# Patient Record
Sex: Male | Born: 1965 | Race: White | Hispanic: No | Marital: Single | Smoking: Former smoker
Health system: Southern US, Community
[De-identification: ages and names within clinical notes are randomized; demographics above are authoritative.]

## PROBLEM LIST (undated history)

## (undated) DIAGNOSIS — M1990 Unspecified osteoarthritis, unspecified site: Secondary | ICD-10-CM

## (undated) DIAGNOSIS — M109 Gout, unspecified: Secondary | ICD-10-CM

## (undated) DIAGNOSIS — W28XXXA Contact with powered lawn mower, initial encounter: Secondary | ICD-10-CM

## (undated) HISTORY — DX: Gout, unspecified: M10.9

## (undated) HISTORY — DX: Contact with powered lawn mower, initial encounter: W28.XXXA

## (undated) HISTORY — PX: COLONOSCOPY: SHX174

---
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
## 2001-12-23 HISTORY — PX: MANDIBLE FRACTURE SURGERY: SHX706

## 2005-04-02 ENCOUNTER — Encounter: Payer: Self-pay | Admitting: General Practice

## 2005-04-22 ENCOUNTER — Encounter: Payer: Self-pay | Admitting: General Practice

## 2007-12-10 ENCOUNTER — Ambulatory Visit: Payer: Self-pay | Admitting: Gastroenterology

## 2010-11-21 ENCOUNTER — Ambulatory Visit: Payer: Self-pay | Admitting: General Practice

## 2013-02-27 ENCOUNTER — Emergency Department: Payer: Self-pay | Admitting: Emergency Medicine

## 2013-03-10 ENCOUNTER — Emergency Department: Payer: Self-pay

## 2013-10-06 ENCOUNTER — Ambulatory Visit: Payer: Self-pay | Admitting: General Practice

## 2015-03-30 ENCOUNTER — Ambulatory Visit (INDEPENDENT_AMBULATORY_CARE_PROVIDER_SITE_OTHER): Payer: BLUE CROSS/BLUE SHIELD | Admitting: General Surgery

## 2015-03-30 ENCOUNTER — Other Ambulatory Visit: Payer: BLUE CROSS/BLUE SHIELD

## 2015-03-30 ENCOUNTER — Encounter: Payer: Self-pay | Admitting: General Surgery

## 2015-03-30 VITALS — BP 124/78 | HR 70 | Resp 14 | Ht 68.0 in | Wt 169.0 lb

## 2015-03-30 DIAGNOSIS — R2231 Localized swelling, mass and lump, right upper limb: Secondary | ICD-10-CM | POA: Diagnosis not present

## 2015-03-30 NOTE — Patient Instructions (Addendum)
The patient is aware to call back for any questions or concerns.  Patient's surgery has been scheduled for 04-04-15 at [facility].

## 2015-03-30 NOTE — Progress Notes (Signed)
Patient ID: Jamie [name], male   DOB: [birth year], 49 y.o.   MRN: [id number]  Chief Complaint  Patient presents with  . Mass    under right arm    HPI Jamie [name] is a 49 y.o. male.  Here today for evaluation of mass under his right arm. He states it was not there when he had his physical first part of March. He noticed it drying off from a shower on the 27th. Denies pain and has not changed in size. Denies any infection, injury or trauma to the arm.  He is here today with his mother, [name].  HPI  Past Medical History  Diagnosis Date  . Accident caused by powered lawn [place]  . Gout     Past Surgical History  Procedure Laterality Date  . Mandible fracture surgery  2003    Family History  Problem Relation Age of Onset  . Cancer Father     colon    Social History History  Substance Use Topics  . Smoking status: Former Smoker -- 1 years    Quit date: 12/24/1991  . Smokeless tobacco: Never Used  . Alcohol Use: 0.0 oz/week    0 Standard drinks or equivalent per week     Comment: 3-4/day    No Known Allergies  Current Outpatient Prescriptions  Medication Sig Dispense Refill  . allopurinol (ZYLOPRIM) 100 MG tablet Take 400 mg by mouth daily.   2   No current facility-administered medications for this visit.    Review of Systems Review of Systems  Constitutional: Negative.   Respiratory: Negative.   Cardiovascular: Negative.     Blood pressure 124/78, pulse 70, resp. rate 14, height 5\' 8"  (1.727 m), weight 169 lb (76.658 kg).  Physical Exam Physical Exam  Constitutional: He is oriented to person, place, and time. He appears well-developed and well-nourished.  Neck: Neck supple.  Cardiovascular: Normal rate, regular rhythm and normal heart sounds.   Pulmonary/Chest: Effort normal and breath sounds normal.  Abdominal: Soft. Normal appearance. There is no splenomegaly or hepatomegaly. There is no tenderness.  Musculoskeletal:        Arms: Lymphadenopathy:    He has no cervical adenopathy.    He has no axillary adenopathy.       Right: No inguinal adenopathy present.       Left: No inguinal adenopathy present.  No left axilla adenopathy  Neurological: He is alert and oriented to person, place, and time.  Skin: Skin is warm and dry.  3 x 6 cm smooth egg shaped mass top of right axilla.   Data Assessment: Ultrasound examination of the mass showed a heterogeneous lesion with focal areas of increased vascular flow. The axillary artery was cephalad by the lesion. No additional adenopathy noted.  Assessment    Right axillary mass, unclear etiology.    Plan    Excision will be necessary to determine the source of this lesion.     Risk and benefits of excision of the nodule discussed.   Patient's surgery has been scheduled for 04-04-15 at [facility].  PCP:  [facility] Ref: Dr. Jonny Ruiz  Robert Bellow 03/31/2015, 10:15 PM

## 2015-03-31 ENCOUNTER — Other Ambulatory Visit: Payer: Self-pay | Admitting: General Surgery

## 2015-03-31 DIAGNOSIS — R2230 Localized swelling, mass and lump, unspecified upper limb: Secondary | ICD-10-CM | POA: Insufficient documentation

## 2015-03-31 DIAGNOSIS — R2231 Localized swelling, mass and lump, right upper limb: Secondary | ICD-10-CM

## 2015-04-04 ENCOUNTER — Encounter: Payer: Self-pay | Admitting: General Surgery

## 2015-04-04 ENCOUNTER — Ambulatory Visit
Admit: 2015-04-04 | Disposition: A | Discharge status: Home / Self Care | Payer: Self-pay | Attending: General Surgery | Admitting: General Surgery

## 2015-04-04 DIAGNOSIS — D1721 Benign lipomatous neoplasm of skin and subcutaneous tissue of right arm: Secondary | ICD-10-CM | POA: Diagnosis not present

## 2015-04-04 HISTORY — PX: OTHER SURGICAL HISTORY: SHX169

## 2015-04-04 LAB — CBC WITH DIFFERENTIAL/PLATELET
EOS PCT: 1 %
HCT: 48 % (ref 40.0–52.0)
HGB: 15.6 g/dL (ref 13.0–18.0)
Lymphocytes: 60 %
MCH: 30.2 pg (ref 26.0–34.0)
MCHC: 32.4 g/dL (ref 32.0–36.0)
MCV: 93 fL (ref 80–100)
MONOS PCT: 6 %
Platelet: 194 10*3/uL (ref 150–440)
RBC: 5.14 10*6/uL (ref 4.40–5.90)
RDW: 14.6 % — ABNORMAL HIGH (ref 11.5–14.5)
SEGMENTED NEUTROPHILS: 21 %
Variant Lymphocyte - H1-Rlymph: 12 %
WBC: 9.1 10*3/uL (ref 3.8–10.6)

## 2015-04-05 ENCOUNTER — Encounter: Payer: Self-pay | Admitting: General Surgery

## 2015-04-06 ENCOUNTER — Encounter: Payer: Self-pay | Admitting: General Surgery

## 2015-04-07 ENCOUNTER — Telehealth: Payer: Self-pay | Admitting: *Deleted

## 2015-04-07 ENCOUNTER — Telehealth: Payer: Self-pay | Admitting: General Surgery

## 2015-04-07 NOTE — Telephone Encounter (Signed)
Pt called office back and he aware of his pathology results.

## 2015-04-07 NOTE — Telephone Encounter (Signed)
Lipoma pathology was benign

## 2015-04-13 ENCOUNTER — Ambulatory Visit (INDEPENDENT_AMBULATORY_CARE_PROVIDER_SITE_OTHER): Payer: BLUE CROSS/BLUE SHIELD | Admitting: General Surgery

## 2015-04-13 ENCOUNTER — Encounter: Payer: Self-pay | Admitting: General Surgery

## 2015-04-13 VITALS — BP 130/78 | HR 74 | Resp 14 | Ht 68.0 in | Wt 165.0 lb

## 2015-04-13 DIAGNOSIS — R2231 Localized swelling, mass and lump, right upper limb: Secondary | ICD-10-CM

## 2015-04-13 NOTE — Progress Notes (Signed)
Patient ID: Jamie [name], male   DOB: [birth year], 49 y.o.   MRN: [id number]  Chief Complaint  Patient presents with  . Routine Post Op    right arm excision    HPI Jamie [name] is a 49 y.o. male here today having undergone excision of a right axillary lipoma on 04/04/15. Patient states he is doing well. Minimal pain at the surgical site, some staining down the proximal aspect of the upper medial arm. HPI  Past Medical History  Diagnosis Date  . Accident caused by powered lawn [place]  . Gout     Past Surgical History  Procedure Laterality Date  . Mandible fracture surgery  2003  . Excision right arm mass  04/04/15    Family History  Problem Relation Age of Onset  . Cancer Father     colon    Social History History  Substance Use Topics  . Smoking status: Former Smoker -- 1 years    Quit date: 12/24/1991  . Smokeless tobacco: Never Used  . Alcohol Use: 0.0 oz/week    0 Standard drinks or equivalent per week     Comment: 3-4/day    No Known Allergies  Current Outpatient Prescriptions  Medication Sig Dispense Refill  . allopurinol (ZYLOPRIM) 100 MG tablet Take 400 mg by mouth daily.   2   No current facility-administered medications for this visit.    Review of Systems Review of Systems  Constitutional: Negative.   Respiratory: Negative.   Cardiovascular: Negative.     Blood pressure 130/78, pulse 74, resp. rate 14, height 5\' 8"  (1.727 m), weight 165 lb (74.844 kg).  Physical Exam Physical Exam  Constitutional: He is oriented to person, place, and time. He appears well-developed and well-nourished.  Neurological: He is alert and oriented to person, place, and time.  Skin: Skin is warm and dry.  Right axilla incision is clean and healing well.     Data Review  Lipoma without evidence of malignancy.  Assessment    Doing well post excision of axillary lipoma. No evidence of seroma.     Plan    The patient will increase his activity as  tolerated. We'll plan on his returning to work on 04/17/2015.  Patient to return as needed.      PCP:  Kandace Parkins 04/13/2015, 8:53 PM

## 2015-04-13 NOTE — Patient Instructions (Signed)
Patient to return as needed. 

## 2015-04-17 LAB — SURGICAL PATHOLOGY

## 2015-04-23 NOTE — Op Note (Signed)
PATIENT NAME:  Jamie [name], Jamie [name] MR#:  [id number] DATE OF BIRTH:  1966-05-15  DATE OF PROCEDURE:  04/04/2015  PREOPERATIVE DIAGNOSIS: Right axillary mass.   POSTOPERATIVE DIAGNOSIS: Right axillary lipoma.   OPERATIVE PROCEDURE: Excision right axillary lipoma.   OPERATING SURGEON: Robert Bellow, M.D.   ANESTHESIA: General by LMA under Dr. Kayleen Memos, Marcaine 0.5% with 1:200,000 units of epinephrine 15 mL local infiltration.   CLINICAL NOTE:  This 49 year old male presented with a new onset mass in the right axilla. Ultrasound showed a homogeneous area with increased peripheral flow. No clear adenopathy. He was considered a candidate for excision. Preoperative CBC was normal.   OPERATIVE NOTE: With the patient under adequate general anesthesia and a small roll behind the shoulder, the area of the right axilla, chest, and upper arm was prepped with ChloraPrep and draped. The axillary artery had been pushed cephalad by the mass and this was identified by palpation and marked. A curvilinear incision in the upper aspect of the axilla parallel to the artery was made and carried down through the skin and subcutaneous tissue with hemostasis achieved by electrocautery and 3-0 Vicryl ties. Marcaine had been infiltrated for postoperative analgesia. After opening the axillary envelope, a lipoma-like mass was identified. This had dissected itself posterior to the axillary vessels and towards the supraspinatus muscle. This was gently freed circumferentially with blunt dissection with the few vascular pedicles controlled with 3-0 Vicryl ties. The specimen was sent for routine histology. Good hemostasis was noted. The wound was closed in layers with 2-0 Vicryl to the axillary envelope and a running 4-0 Vicryl subcuticular suture for the skin. The lesion measured approximately 8 cm in maximum diameter.   Benzoin, Steri-Strips, Telfa, and Tegaderm dressing were applied.   The patient tolerated the procedure well and  was taken to the recovery room in stable condition.   ____________________________ Robert Bellow, MD jwb:sp D: 04/05/2015 08:44:57 ET T: 04/05/2015 10:52:40 ET JOB#: [id number]  cc: Robert Bellow, MD, <Dictator> Nelda Severe. Burt Ek, MD Park Breed, MD Ennifer Harston Amedeo Kinsman MD ELECTRONICALLY SIGNED 04/07/2015 0:23

## 2016-06-17 ENCOUNTER — Ambulatory Visit (INDEPENDENT_AMBULATORY_CARE_PROVIDER_SITE_OTHER): Payer: Self-pay

## 2016-06-17 ENCOUNTER — Ambulatory Visit
Admission: EM | Admit: 2016-06-17 | Discharge: 2016-06-17 | Disposition: A | Discharge status: Home / Self Care | Payer: Self-pay | Attending: Family Medicine | Admitting: Family Medicine

## 2016-06-17 DIAGNOSIS — S82402A Unspecified fracture of shaft of left fibula, initial encounter for closed fracture: Secondary | ICD-10-CM

## 2016-06-17 DIAGNOSIS — M79605 Pain in left leg: Secondary | ICD-10-CM

## 2016-06-17 DIAGNOSIS — R52 Pain, unspecified: Secondary | ICD-10-CM

## 2016-06-17 DIAGNOSIS — M25571 Pain in right ankle and joints of right foot: Secondary | ICD-10-CM

## 2016-06-17 DIAGNOSIS — M79604 Pain in right leg: Secondary | ICD-10-CM

## 2016-06-17 MED ORDER — HYDROCODONE-ACETAMINOPHEN 5-325 MG PO TABS: ORAL_TABLET | ORAL | Status: DC

## 2016-06-17 MED ORDER — KETOROLAC TROMETHAMINE 60 MG/2ML IM SOLN
60.0000 mg | Freq: Once | INTRAMUSCULAR | Status: AC
  Administered 2016-06-17: 60 mg via INTRAMUSCULAR

## 2016-06-17 NOTE — ED Notes (Signed)
Patient presents with bilateral lower leg pain. Patient states he was mowing early today around noon when he had to jump off his zero turn lawn mower. The mower was still engaged and when it started it took off and the patient was hit by the mower on both his legs.

## 2016-06-17 NOTE — ED Provider Notes (Signed)
CSN: [id number]     Arrival date & time 06/17/16  1930 History   First MD Initiated Contact with Patient 06/17/16 2034     Chief Complaint  Patient presents with  . Leg Pain   (Consider location/radiation/quality/duration/timing/severity/associated sxs/prior Treatment) HPI Comments: 50 yo male with a c/o bilateral lower leg pain after injuring them earlier this evening with his lawn mower. Patient states the lawn mower took off and he was hit by the mower on both his legs as he was chasing it. Denies lacerations or bleeding. Complains of pain and swelling to both ankles and his left mid shin area. Denies any numbness/tingling or discoloration of his legs.   The history is provided by the patient.    Past Medical History  Diagnosis Date  . Accident caused by powered lawn [place]  . Gout    Past Surgical History  Procedure Laterality Date  . Mandible fracture surgery  2003  . Excision right arm mass  04/04/15   Family History  Problem Relation Age of Onset  . Cancer Father     colon   Social History  Substance Use Topics  . Smoking status: Former Smoker -- 1 years    Quit date: 12/24/1991  . Smokeless tobacco: Never Used  . Alcohol Use: 0.0 oz/week    0 Standard drinks or equivalent per week     Comment: 3-4/day    Review of Systems  Allergies  Review of patient's allergies indicates no known allergies.  Home Medications   Prior to Admission medications   Medication Sig Start Date End Date Taking? Authorizing Provider  allopurinol (ZYLOPRIM) 100 MG tablet Take 400 mg by mouth daily.  03/13/15  Yes Historical Provider, MD  HYDROcodone-acetaminophen (NORCO/VICODIN) 5-325 MG tablet 1-2 tabs po q 8 hours prn 06/17/16   Norval Gable, MD   Meds Ordered and Administered this Visit   Medications  ketorolac (TORADOL) injection 60 mg (60 mg Intramuscular Given 06/17/16 2048)    BP 151/91 mmHg  Pulse 60  Temp(Src) 98.9 F (37.2 C) (Oral)  Resp 18  Ht 5\' 8"  (1.727 m)   Wt 168 lb (76.204 kg)  BMI 25.55 kg/m2  SpO2 100% No data found.   Physical Exam  Constitutional: He appears well-developed and well-nourished. No distress.  Musculoskeletal: He exhibits edema.       Right ankle: He exhibits decreased range of motion and swelling. He exhibits no ecchymosis, no deformity, no laceration and normal pulse. Tenderness. Lateral malleolus and AITFL tenderness found. No medial malleolus, no CF ligament, no posterior TFL, no head of 5th metatarsal and no proximal fibula tenderness found. Achilles tendon normal.       Left ankle: He exhibits decreased range of motion and swelling. He exhibits no ecchymosis, no deformity, no laceration and normal pulse. Tenderness. Lateral malleolus and AITFL tenderness found. No medial malleolus, no CF ligament, no posterior TFL, no head of 5th metatarsal and no proximal fibula tenderness found. Achilles tendon normal.       Left lower leg: He exhibits tenderness (lateral upper third of lower leg), swelling and edema. He exhibits no deformity and no laceration.       Legs: Bilateral lower extremities neurovascularly intact  Skin: He is not diaphoretic.  Nursing note and vitals reviewed.   ED Course  Procedures (including critical care time)  Labs Review Labs Reviewed - No data to display  Imaging Review Dg Tibia/fibula Left  06/17/2016  CLINICAL DATA:  Pain after trauma EXAM:  LEFT TIBIA AND FIBULA - 2 VIEW COMPARISON:  None. FINDINGS: There is a mildly displaced spiral fracture of the proximal fibular diaphysis. No other acute abnormalities. IMPRESSION: Fracture of the proximal fibula. Electronically Signed   By: Dorise Bullion III M.D   On: 06/17/2016 20:12   Dg Tibia/fibula Right  06/17/2016  CLINICAL DATA:  Pain after trauma. EXAM: RIGHT TIBIA AND FIBULA - 2 VIEW COMPARISON:  None. FINDINGS: There is no evidence of fracture or other focal bone lesions. Soft tissues are unremarkable. IMPRESSION: Negative. Electronically Signed    By: Dorise Bullion III M.D   On: 06/17/2016 20:13   Dg Ankle Complete Left  06/17/2016  CLINICAL DATA:  Pain after fall. EXAM: LEFT ANKLE COMPLETE - 3+ VIEW COMPARISON:  None. FINDINGS: Calcifications adjacent to the medial malleolus are consistent with an age indeterminate avulsion injury. There is no overlapping soft tissue swelling. No convincing evidence of acute fracture. The ankle mortise is intact. IMPRESSION: Calcifications adjacent to the medial malleolus are age indeterminate but favored to be chronic as there is no overlapping soft tissue swelling. The findings are consistent with sequela of an avulsion injury. No other abnormalities. Electronically Signed   By: Dorise Bullion III M.D   On: 06/17/2016 21:04   Dg Ankle Complete Right  06/17/2016  CLINICAL DATA:  Pain after trauma EXAM: RIGHT ANKLE - COMPLETE 3+ VIEW COMPARISON:  None. FINDINGS: Mild lateral soft tissue swelling.  No fractures identified. IMPRESSION: Lateral soft tissue swelling. Electronically Signed   By: Dorise Bullion III M.D   On: 06/17/2016 21:05     Visual Acuity Review  Right Eye Distance:   Left Eye Distance:   Bilateral Distance:    Right Eye Near:   Left Eye Near:    Bilateral Near:         MDM   1. Fibula fracture, left, closed, initial encounter   2. Pain    Discharge Medication List as of 06/17/2016  9:19 PM    START taking these medications   Details  HYDROcodone-acetaminophen (NORCO/VICODIN) 5-325 MG tablet 1-2 tabs po q 8 hours prn, Print       1. x-ray results and diagnosis reviewed with patient 2. rx as per orders above; reviewed possible side effects, interactions, risks and benefits  3. Left lower extremity immobilized with posterior leg splint and sugar tong; crutches for non-weightbearing 4. Follow-up in next 1-2 days with orthopedist [facility])  Norval Gable, MD 06/17/16 2153

## 2016-06-17 NOTE — Discharge Instructions (Signed)
Tibial and Fibular Fracture, Adult °Tibial and fibular fracture is a break in the bones of your lower leg (tibia and fibula). The tibia is the larger of these two bones. The fibula is the smaller of the two bones. It is on the outer side of your leg.  °CAUSES °· Low-energy injuries, such as a fall from ground level. °· High-energy injuries, such as motor vehicle injuries, gunshot wounds, or high-speed sports collisions. °RISK FACTORS °· Jumping activities. °· Repetitive stress, such as long-distance running. °· Participation in sports. °· Osteoporosis. °· Advanced age. °SIGNS AND SYMPTOMS °· Pain. °· Swelling. °· Inability to put weight on your injured leg. °· Bone deformities at the site of your injury. °· Bruising. °DIAGNOSIS  °Tibial and fibular fractures are diagnosed with the use of X-ray exams. °TREATMENT  °If you have a simple fracture of these two bones, they can be treated with simple immobilization. A cast or splint will be used on your leg to keep it from moving while it heals. Then you can begin range-of-motion exercises to regain your knee motion. °HOME CARE INSTRUCTIONS  °· Apply ice to your leg: °¨ Put ice in a plastic bag. °¨ Place a towel between your skin and the bag. °¨ Leave the ice on for 20 minutes, 2-3 times a day. °· If you have a plaster or fiberglass cast: °¨ Do not try to scratch the skin under the cast using sharp or pointed objects. °¨ Check the skin around the cast every day. You may put lotion on any red or sore areas. °¨ Keep your cast dry and clean. °· If you have a plaster splint: °¨ Wear the splint as directed. °¨ You may loosen the elastic around the splint if your toes become numb, tingle, or turn cold or blue. °· Do not put pressure on any part of your cast or splint until it is fully hardened, because it may deform. °· Your cast or splint can be protected during bathing with a plastic bag. Do not lower the cast or splint into water. °· Use crutches as directed. °· Only take  over-the-counter or prescription medicines for pain, discomfort, or fever as directed by your health care provider. °· Follow all instructions given to you by your health care provider. °· Make and keep all follow-up appointments. °SEEK MEDICAL CARE IF: °· Your pain is becoming worse rather than better or is not controlled with medicines. °· You have increased swelling or redness in the foot. °· You begin to lose feeling in your foot or toes. °SEEK IMMEDIATE MEDICAL CARE IF: °· You develop a cold or blue foot or toes on the injured side. °· You develop severe pain in your injured leg, especially if the pain is increased with movement of your toes. °MAKE SURE YOU: °· Understand these instructions. °· Will watch your condition. °· Will get help right away if you are not doing well or get worse. °  °This information is not intended to replace advice given to you by your health care provider. Make sure you discuss any questions you have with your health care provider. °  °Document Released: 08/31/2002 Document Revised: 04/25/2015 Document Reviewed: 07/21/2013 °Elsevier Interactive Patient Education ©2016 [employer] ° °

## 2016-06-26 ENCOUNTER — Encounter: Payer: Self-pay | Admitting: Emergency Medicine

## 2017-12-23 DIAGNOSIS — K5790 Diverticulosis of intestine, part unspecified, without perforation or abscess without bleeding: Secondary | ICD-10-CM

## 2017-12-23 HISTORY — DX: Diverticulosis of intestine, part unspecified, without perforation or abscess without bleeding: K57.90

## 2018-01-31 IMAGING — CR DG ANKLE COMPLETE 3+V*R*
3 series · 3 of 3 positions shown · non-contrast
Comparison: None.

CLINICAL DATA: Pain after trauma

EXAM:
RIGHT ANKLE - COMPLETE 3+ VIEW

[ankle ap]
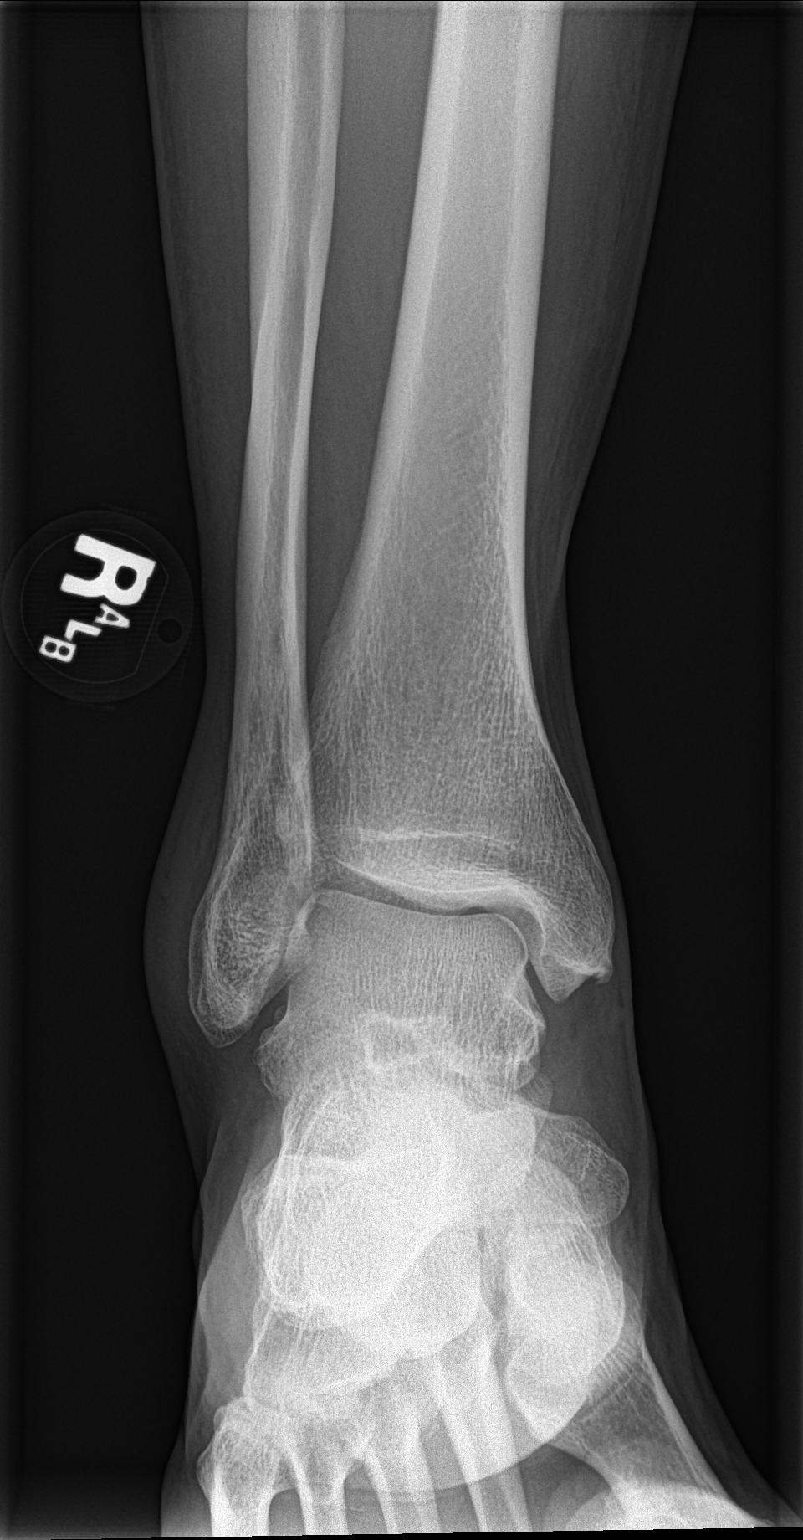

[ankle obl]
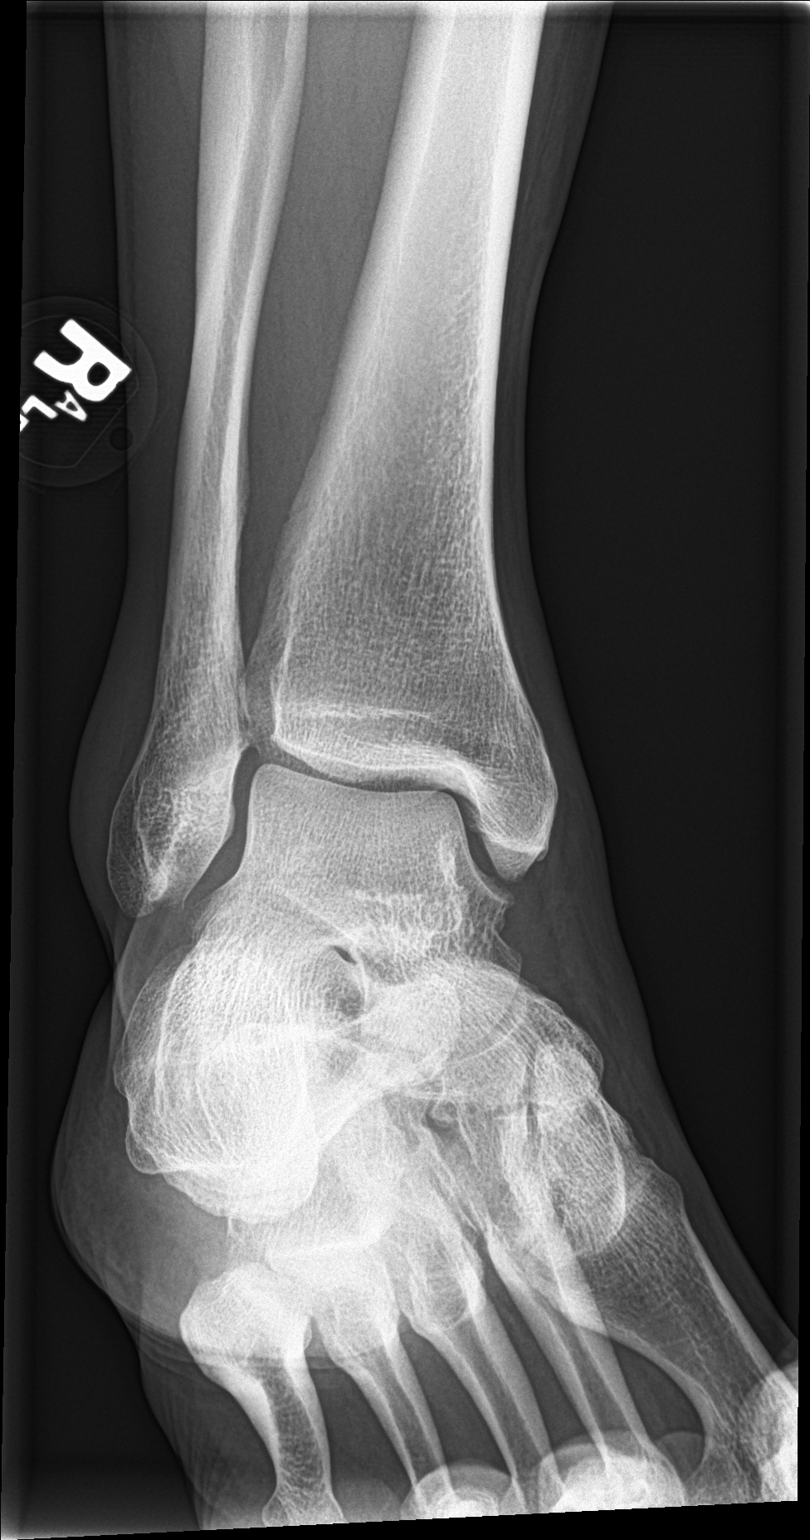

[ankle lat]
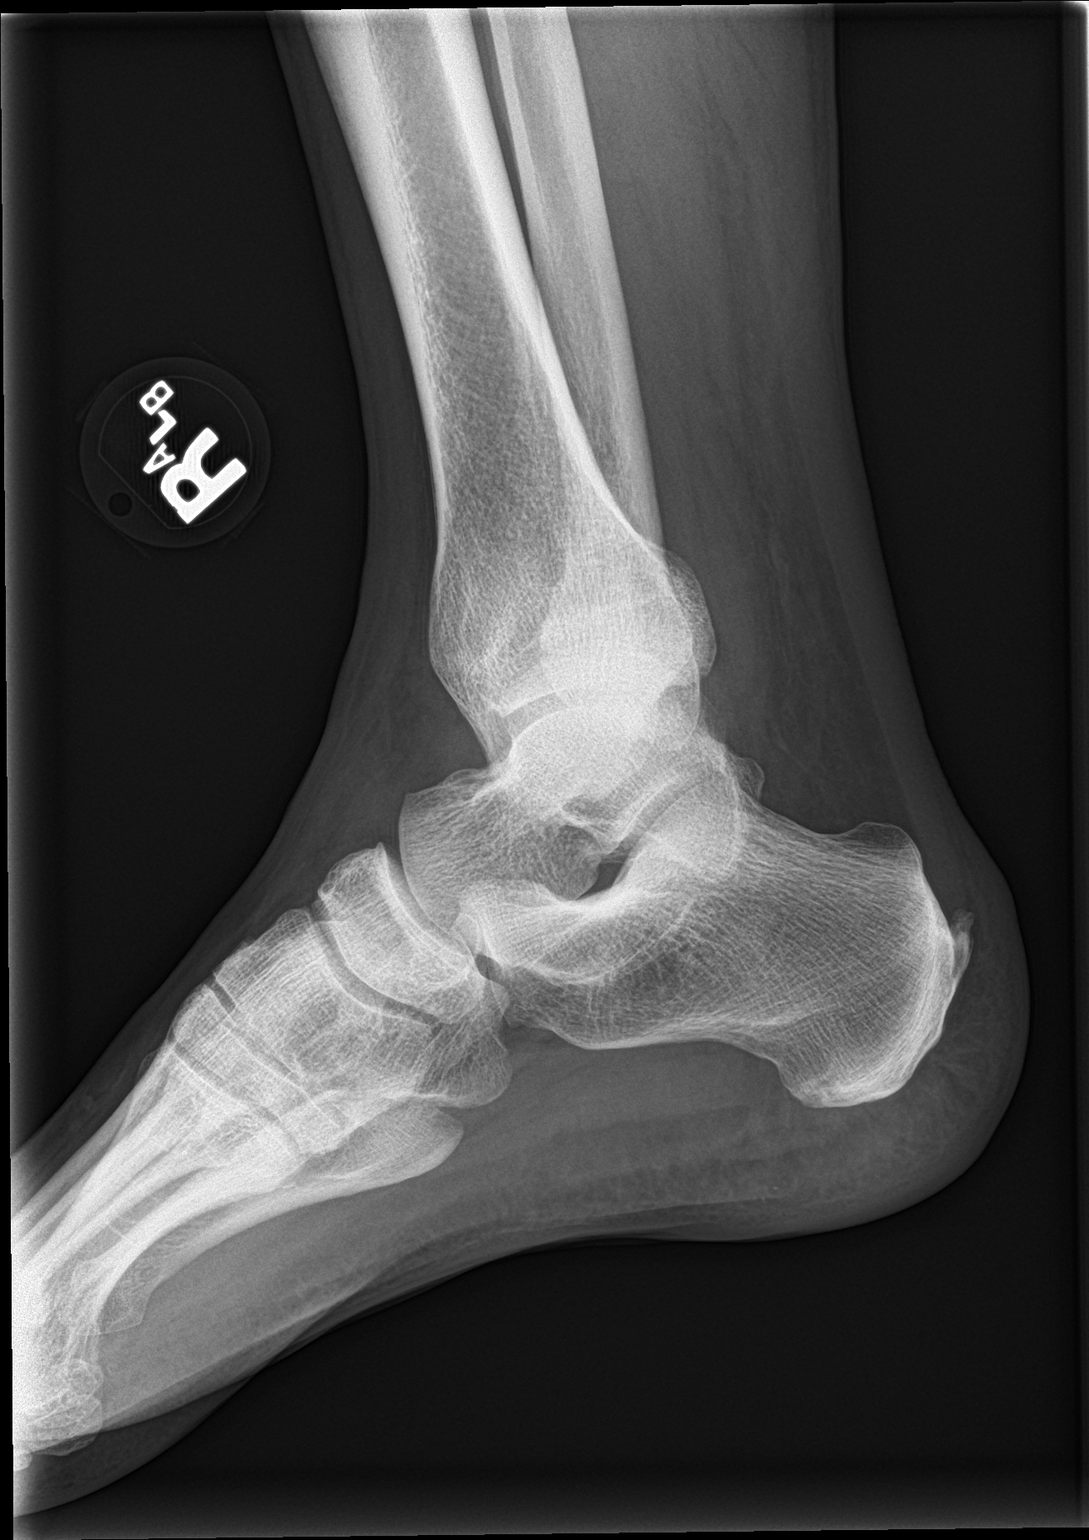

[3 of 3 positions shown; findings below may reference images not displayed]

FINDINGS: Mild lateral soft tissue swelling.  No fractures identified.
IMPRESSION: Lateral soft tissue swelling.

## 2021-02-27 ENCOUNTER — Other Ambulatory Visit: Payer: Self-pay | Admitting: Physician Assistant

## 2021-02-27 ENCOUNTER — Other Ambulatory Visit: Payer: Self-pay

## 2021-02-27 ENCOUNTER — Ambulatory Visit
Admission: RE | Admit: 2021-02-27 | Discharge: 2021-02-27 | Disposition: A | Discharge status: Home / Self Care | Payer: No Typology Code available for payment source | Source: Ambulatory Visit | Attending: Physician Assistant | Admitting: Physician Assistant

## 2021-02-27 ENCOUNTER — Ambulatory Visit
Admission: RE | Admit: 2021-02-27 | Discharge: 2021-02-27 | Disposition: A | Discharge status: Home / Self Care | Payer: No Typology Code available for payment source | Attending: Physician Assistant | Admitting: Physician Assistant

## 2021-02-27 DIAGNOSIS — M25571 Pain in right ankle and joints of right foot: Secondary | ICD-10-CM

## 2021-10-05 ENCOUNTER — Encounter
Admission: RE | Admit: 2021-10-05 | Discharge: 2021-10-05 | Disposition: A | Discharge status: Home / Self Care | Payer: Managed Care, Other (non HMO) | Source: Ambulatory Visit | Attending: Specialist | Admitting: Specialist

## 2021-10-05 ENCOUNTER — Other Ambulatory Visit: Payer: Self-pay

## 2021-10-05 HISTORY — DX: Unspecified osteoarthritis, unspecified site: M19.90

## 2021-10-05 NOTE — Patient Instructions (Addendum)
Your procedure is scheduled on: Monday, October 24 Report to the Registration Desk on the 1st floor of the [facility]. To find out your arrival time, please call [phone] between 1PM - 3PM on: Friday, October 21  REMEMBER: Instructions that are not followed completely may result in serious medical risk, up to and including death; or upon the discretion of your surgeon and anesthesiologist your surgery may need to be rescheduled.  Do not eat food after midnight the night before surgery.  No gum chewing, lozengers or hard candies.  You may however, drink CLEAR liquids up to 2 hours before you are scheduled to arrive for your surgery. Do not drink anything within 2 hours of your scheduled arrival time.  Clear liquids include: - water  - apple juice without pulp - gatorade (not RED, PURPLE, OR BLUE) - black coffee or tea (Do NOT add milk or creamers to the coffee or tea) Do NOT drink anything that is not on this list.  DO NOT TAKE ANY MEDICATIONS THE MORNING OF SURGERY   One week prior to surgery: STARTING October 17 Stop MELOXICAM, AND Anti-inflammatories (NSAIDS) such as Advil, Aleve, Ibuprofen, Motrin, Naproxen, Naprosyn and Aspirin based products such as Excedrin, Goodys Powder, BC Powder. Stop ANY OVER THE COUNTER supplements until after surgery. You may however, continue to take Tylenol if needed for pain up until the day of surgery.  No Alcohol for 24 hours before or after surgery.  No Smoking including e-cigarettes for 24 hours prior to surgery.  No chewable tobacco products for at least 6 hours prior to surgery.  No nicotine patches on the day of surgery.  Do not use any "recreational" drugs for at least a week prior to your surgery.  Please be advised that the combination of cocaine and anesthesia may have negative outcomes, up to and including death. If you test positive for cocaine, your surgery will be cancelled.  On the morning of surgery brush your teeth with  toothpaste and water, you may rinse your mouth with mouthwash if you wish. Do not swallow any toothpaste or mouthwash.  Shower using antibacterial soap prior to arriving at the hospital on the day of surgery.  Do not wear jewelry.  Do not wear lotions, powders, or perfumes.   Do not bring valuables to the hospital. [facility] is not responsible for any missing/lost belongings or valuables.   Notify your doctor if there is any change in your medical condition (cold, fever, infection).  Wear comfortable clothing (specific to your surgery type) to the hospital.  If you are being discharged the day of surgery, you will not be allowed to drive home. You will need a responsible adult (18 years or older) to drive you home and stay with you that night.   If you are taking public transportation, you will need to have a responsible adult (18 years or older) with you. Please confirm with your physician that it is acceptable to use public transportation.   Please call the [place] Dept. at [phone] if you have any questions about these instructions.  Surgery Visitation Policy:  Patients undergoing a surgery or procedure may have one family member or support person with them as long as that person is not COVID-19 positive or experiencing its symptoms.  That person may remain in the waiting area during the procedure and may rotate out with other people.

## 2021-10-08 ENCOUNTER — Other Ambulatory Visit: Payer: Self-pay | Admitting: Specialist

## 2021-10-08 NOTE — H&P (Signed)
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  Symptoms are rated as moderate to severe, and have been worsening.  He has failed nonoperative treatment.  This is significantly impairing activities of daily living.  He has elected for surgical management.   Past Medical History:  Diagnosis Date   Accident caused by powered lawn mower 12/23/1980   Diverticulosis 2019   Gout    Osteoarthritis    Past Surgical History:  Procedure Laterality Date   AMPUTATION TOE Right 1980   partial great toe and second toe; lawn mower accident   COLONOSCOPY     2008, 2019   excision right arm mass  04/04/2015   lipoma   MANDIBLE FRACTURE SURGERY  12/23/2001   Social History   Socioeconomic History   Marital status: Single    Spouse name: Not on file   Number of children: Not on file   Years of education: Not on file   Highest education level: Not on file  Occupational History   Not on file  Tobacco Use   Smoking status: Former    Years: 1.00    Types: Cigarettes    Quit date: 12/24/1991    Years since quitting: 29.8   Smokeless tobacco: Never  Vaping Use   Vaping Use: Never used  Substance and Sexual Activity   Alcohol use: Yes    Comment: 6/day beer   Drug use: No   Sexual activity: Not on file  Other Topics Concern   Not on file  Social History Narrative   Not on file   Social Determinants of Health   Financial Resource Strain: Not on file  Food Insecurity: Not on file  Transportation Needs: Not on file  Physical Activity: Not on file  Stress: Not on file  Social Connections: Not on file   Family History  Problem Relation Age of Onset   Cancer Father        colon   No Known Allergies Prior to Admission medications   Medication Sig Start Date  End Date Taking? Authorizing Provider  allopurinol (ZYLOPRIM) 100 MG tablet Take 400 mg by mouth daily.  03/13/15   [provider]  meloxicam (MOBIC) 15 MG tablet Take 15 mg by mouth daily.    [provider]     Positive ROS: All other systems have been reviewed and were otherwise negative with the exception of those mentioned in the HPI and as above.  Physical Exam: General: Alert, no acute distress Cardiovascular: No pedal edema. Heart is regular and without murmur.  Respiratory: No cyanosis, no use of accessory musculature. Lungs are clear. GI: No organomegaly, abdomen is soft and non-tender Skin: No lesions in the area of chief complaint Neurologic: Sensation intact distally Psychiatric: Patient is competent for consent with normal mood and affect Lymphatic: No axillary or cervical lymphadenopathy  MUSCULOSKELETAL: Prominence of the left thumb CMC joint.  Lateral subluxation of the metacarpal.  Severely positive grind test.  Decreased pinch.  Neurovascular status is normal.  Median nerve compression is negative.  The skin is intact.  Assessment: M18. 1 2 osteoarthritis of first carpometacarpal joint, left thumb  Plan: Plan for Procedure(s): CARPOMETACARPAL (CMC) arthroplasty OF THUMB using the palmaris longus tendon  The risks benefits and alternatives were discussed with the patient including but  not limited to the risks of nonoperative treatment, versus surgical intervention including infection, bleeding, nerve injury,  blood clots, cardiopulmonary complications, morbidity, mortality, among others, and they were willing to proceed.   Park Breed, MD [phone]   10/08/2021 9:27 PM

## 2021-10-11 ENCOUNTER — Inpatient Hospital Stay: Admission: RE | Admit: 2021-10-11 | Payer: Managed Care, Other (non HMO) | Source: Ambulatory Visit

## 2021-10-15 ENCOUNTER — Ambulatory Visit: Payer: Managed Care, Other (non HMO)

## 2021-10-15 ENCOUNTER — Other Ambulatory Visit: Payer: Self-pay

## 2021-10-15 ENCOUNTER — Ambulatory Visit: Payer: Managed Care, Other (non HMO) | Admitting: Certified Registered"

## 2021-10-15 ENCOUNTER — Encounter
Admission: RE | Disposition: A | Discharge status: Home / Self Care | Payer: Self-pay | Source: Ambulatory Visit | Attending: Specialist

## 2021-10-15 ENCOUNTER — Ambulatory Visit
Admission: RE | Admit: 2021-10-15 | Discharge: 2021-10-15 | Disposition: A | Discharge status: Home / Self Care | Payer: Managed Care, Other (non HMO) | Source: Ambulatory Visit | Attending: Specialist | Admitting: Specialist

## 2021-10-15 ENCOUNTER — Encounter: Payer: Self-pay | Admitting: Specialist

## 2021-10-15 DIAGNOSIS — Z791 Long term (current) use of non-steroidal anti-inflammatories (NSAID): Secondary | ICD-10-CM | POA: Diagnosis not present

## 2021-10-15 DIAGNOSIS — Z79899 Other long term (current) drug therapy: Secondary | ICD-10-CM | POA: Insufficient documentation

## 2021-10-15 DIAGNOSIS — Z87891 Personal history of nicotine dependence: Secondary | ICD-10-CM | POA: Diagnosis not present

## 2021-10-15 DIAGNOSIS — M1812 Unilateral primary osteoarthritis of first carpometacarpal joint, left hand: Secondary | ICD-10-CM | POA: Insufficient documentation

## 2021-10-15 HISTORY — PX: CARPOMETACARPAL (CMC) FUSION OF THUMB: SHX6290

## 2021-10-15 SURGERY — CARPOMETACARPAL (CMC) FUSION OF THUMB
Anesthesia: General | Site: Thumb | Laterality: Left

## 2021-10-15 MED ORDER — CEFAZOLIN SODIUM-DEXTROSE 2-4 GM/100ML-% IV SOLN
2.0000 g | INTRAVENOUS | Status: AC
  Administered 2021-10-15: 2 g via INTRAVENOUS

## 2021-10-15 MED ORDER — CLINDAMYCIN PHOSPHATE 600 MG/50ML IV SOLN
600.0000 mg | INTRAVENOUS | Status: AC
  Administered 2021-10-15: 600 mg via INTRAVENOUS

## 2021-10-15 MED ORDER — FENTANYL CITRATE (PF) 100 MCG/2ML IJ SOLN
INTRAMUSCULAR | Status: DC | PRN
  Administered 2021-10-15: 50 ug via INTRAVENOUS
  Administered 2021-10-15: 25 ug via INTRAVENOUS

## 2021-10-15 MED ORDER — BUPIVACAINE HCL (PF) 0.5 % IJ SOLN
INTRAMUSCULAR | Status: DC | PRN
  Administered 2021-10-15: 16 mL

## 2021-10-15 MED ORDER — CHLORHEXIDINE GLUCONATE 0.12 % MT SOLN: 15.0000 mL | Freq: Once | OROMUCOSAL | Status: AC

## 2021-10-15 MED ORDER — CLINDAMYCIN PHOSPHATE 600 MG/50ML IV SOLN
INTRAVENOUS | Status: AC
  Filled 2021-10-15: qty 50

## 2021-10-15 MED ORDER — GABAPENTIN 300 MG PO CAPS
ORAL_CAPSULE | ORAL | Status: AC
  Administered 2021-10-15: 300 mg via ORAL
  Filled 2021-10-15: qty 1

## 2021-10-15 MED ORDER — GABAPENTIN 300 MG PO CAPS: 300.0000 mg | ORAL_CAPSULE | ORAL | Status: AC

## 2021-10-15 MED ORDER — CHLORHEXIDINE GLUCONATE 0.12 % MT SOLN
OROMUCOSAL | Status: AC
  Administered 2021-10-15: 15 mL via OROMUCOSAL
  Filled 2021-10-15: qty 15

## 2021-10-15 MED ORDER — CHLORHEXIDINE GLUCONATE CLOTH 2 % EX PADS
6.0000 | MEDICATED_PAD | Freq: Once | CUTANEOUS | Status: AC
  Administered 2021-10-15: 6 via TOPICAL

## 2021-10-15 MED ORDER — CEFAZOLIN SODIUM-DEXTROSE 2-4 GM/100ML-% IV SOLN
INTRAVENOUS | Status: AC
  Filled 2021-10-15: qty 100

## 2021-10-15 MED ORDER — HYDROCODONE-ACETAMINOPHEN 5-325 MG PO TABS
ORAL_TABLET | ORAL | Status: AC
  Filled 2021-10-15: qty 1

## 2021-10-15 MED ORDER — DEXAMETHASONE SODIUM PHOSPHATE 10 MG/ML IJ SOLN
INTRAMUSCULAR | Status: AC
  Filled 2021-10-15: qty 1

## 2021-10-15 MED ORDER — PROPOFOL 10 MG/ML IV BOLUS
INTRAVENOUS | Status: AC
  Filled 2021-10-15: qty 20

## 2021-10-15 MED ORDER — OXYCODONE HCL 5 MG PO TABS
ORAL_TABLET | ORAL | Status: AC
  Filled 2021-10-15: qty 1

## 2021-10-15 MED ORDER — MIDAZOLAM HCL 2 MG/2ML IJ SOLN
INTRAMUSCULAR | Status: AC
  Filled 2021-10-15: qty 2

## 2021-10-15 MED ORDER — ONDANSETRON HCL 4 MG/2ML IJ SOLN
INTRAMUSCULAR | Status: AC
  Filled 2021-10-15: qty 2

## 2021-10-15 MED ORDER — EPHEDRINE SULFATE 50 MG/ML IJ SOLN
INTRAMUSCULAR | Status: DC | PRN
  Administered 2021-10-15: 10 mg via INTRAVENOUS
  Administered 2021-10-15: 15 mg via INTRAVENOUS
  Administered 2021-10-15: 10 mg via INTRAVENOUS

## 2021-10-15 MED ORDER — DEXMEDETOMIDINE HCL IN NACL 400 MCG/100ML IV SOLN
INTRAVENOUS | Status: DC | PRN
  Administered 2021-10-15: 12 ug via INTRAVENOUS

## 2021-10-15 MED ORDER — ACETAMINOPHEN 10 MG/ML IV SOLN
INTRAVENOUS | Status: DC | PRN
  Administered 2021-10-15: 1000 mg via INTRAVENOUS

## 2021-10-15 MED ORDER — OXYCODONE HCL 5 MG PO TABS
5.0000 mg | ORAL_TABLET | Freq: Once | ORAL | Status: AC
Start: 2021-10-15 — End: 2021-10-15
  Administered 2021-10-15: 5 mg via ORAL

## 2021-10-15 MED ORDER — FENTANYL CITRATE (PF) 100 MCG/2ML IJ SOLN
INTRAMUSCULAR | Status: AC
  Filled 2021-10-15: qty 2

## 2021-10-15 MED ORDER — FAMOTIDINE 20 MG PO TABS: 20.0000 mg | ORAL_TABLET | Freq: Once | ORAL | Status: AC

## 2021-10-15 MED ORDER — EPHEDRINE 5 MG/ML INJ
INTRAVENOUS | Status: AC
  Filled 2021-10-15: qty 10

## 2021-10-15 MED ORDER — SEVOFLURANE IN SOLN
RESPIRATORY_TRACT | Status: AC
  Filled 2021-10-15: qty 250

## 2021-10-15 MED ORDER — MELOXICAM 7.5 MG PO TABS: 15.0000 mg | ORAL_TABLET | ORAL | Status: AC

## 2021-10-15 MED ORDER — ACETAMINOPHEN 10 MG/ML IV SOLN
INTRAVENOUS | Status: AC
  Filled 2021-10-15: qty 100

## 2021-10-15 MED ORDER — DEXAMETHASONE SODIUM PHOSPHATE 10 MG/ML IJ SOLN
INTRAMUSCULAR | Status: DC | PRN
  Administered 2021-10-15: 4 mg via INTRAVENOUS

## 2021-10-15 MED ORDER — FENTANYL CITRATE (PF) 100 MCG/2ML IJ SOLN: 25.0000 ug | INTRAMUSCULAR | Status: DC | PRN

## 2021-10-15 MED ORDER — SODIUM CHLORIDE 0.9 % IR SOLN: Status: DC | PRN

## 2021-10-15 MED ORDER — HYDROCODONE-ACETAMINOPHEN 5-325 MG PO TABS: 1.0000 | ORAL_TABLET | Freq: Four times a day (QID) | ORAL | 0 refills | Status: DC | PRN

## 2021-10-15 MED ORDER — ONDANSETRON HCL 4 MG/2ML IJ SOLN
INTRAMUSCULAR | Status: DC | PRN
  Administered 2021-10-15: 4 mg via INTRAVENOUS

## 2021-10-15 MED ORDER — HYDROCODONE-ACETAMINOPHEN 5-325 MG PO TABS
1.0000 | ORAL_TABLET | Freq: Once | ORAL | Status: DC
Start: 2021-10-15 — End: 2021-10-15

## 2021-10-15 MED ORDER — BUPIVACAINE HCL (PF) 0.5 % IJ SOLN
INTRAMUSCULAR | Status: AC
  Filled 2021-10-15: qty 30

## 2021-10-15 MED ORDER — MELOXICAM 7.5 MG PO TABS
ORAL_TABLET | ORAL | Status: AC
  Administered 2021-10-15: 15 mg via ORAL
  Filled 2021-10-15: qty 2

## 2021-10-15 MED ORDER — PROMETHAZINE HCL 25 MG/ML IJ SOLN: 6.2500 mg | INTRAMUSCULAR | Status: DC | PRN

## 2021-10-15 MED ORDER — PHENYLEPHRINE HCL-NACL 20-0.9 MG/250ML-% IV SOLN
INTRAVENOUS | Status: AC
  Filled 2021-10-15: qty 250

## 2021-10-15 MED ORDER — GABAPENTIN 400 MG PO CAPS: 400.0000 mg | ORAL_CAPSULE | Freq: Three times a day (TID) | ORAL | 3 refills | Status: DC

## 2021-10-15 MED ORDER — PROPOFOL 10 MG/ML IV BOLUS
INTRAVENOUS | Status: DC | PRN
  Administered 2021-10-15: 300 mg via INTRAVENOUS
  Administered 2021-10-15: 100 mg via INTRAVENOUS

## 2021-10-15 MED ORDER — FAMOTIDINE 20 MG PO TABS
ORAL_TABLET | ORAL | Status: AC
  Administered 2021-10-15: 20 mg via ORAL
  Filled 2021-10-15: qty 1

## 2021-10-15 MED ORDER — MIDAZOLAM HCL 2 MG/2ML IJ SOLN
INTRAMUSCULAR | Status: DC | PRN
  Administered 2021-10-15: 2 mg via INTRAVENOUS

## 2021-10-15 MED ORDER — PROPOFOL 1000 MG/100ML IV EMUL
INTRAVENOUS | Status: AC
  Filled 2021-10-15: qty 100

## 2021-10-15 MED ORDER — ORAL CARE MOUTH RINSE: 15.0000 mL | Freq: Once | OROMUCOSAL | Status: AC

## 2021-10-15 MED ORDER — LACTATED RINGERS IV SOLN: INTRAVENOUS | Status: DC

## 2021-10-15 SURGICAL SUPPLY — 50 items
BLADE OSC/SAGITTAL MD 5.5X18 (BLADE) ×2 IMPLANT
BLADE SURG MINI STRL (BLADE) ×2 IMPLANT
BNDG ESMARK 4X12 TAN STRL LF (GAUZE/BANDAGES/DRESSINGS) ×2 IMPLANT
CHLORAPREP W/TINT 26 (MISCELLANEOUS) ×2 IMPLANT
CUFF TOURN SGL QUICK 18X4 (TOURNIQUET CUFF) ×1 IMPLANT
DECANTER SPIKE VIAL GLASS SM (MISCELLANEOUS) ×2 IMPLANT
DRAPE FLUOR MINI C-ARM 54X84 (DRAPES) ×2 IMPLANT
DRSG GAUZE FLUFF 36X18 (GAUZE/BANDAGES/DRESSINGS) ×6 IMPLANT
ELECT REM PT RETURN 9FT ADLT (ELECTROSURGICAL) ×2
ELECTRODE REM PT RTRN 9FT ADLT (ELECTROSURGICAL) ×1 IMPLANT
GAUZE 4X4 16PLY ~~LOC~~+RFID DBL (SPONGE) ×2 IMPLANT
GAUZE XEROFORM 1X8 LF (GAUZE/BANDAGES/DRESSINGS) ×2 IMPLANT
GLOVE SURG ENC MOIS LTX SZ7.5 (GLOVE) ×2 IMPLANT
GLOVE SURG ENC MOIS LTX SZ8 (GLOVE) IMPLANT
GOWN STRL REUS W/ TWL LRG LVL3 (GOWN DISPOSABLE) ×1 IMPLANT
GOWN STRL REUS W/TWL LRG LVL3 (GOWN DISPOSABLE) ×1
GOWN STRL REUS W/TWL LRG LVL4 (GOWN DISPOSABLE) ×2 IMPLANT
KIT TURNOVER KIT A (KITS) ×2 IMPLANT
LOOP VESSEL MINI 0.8X406 BLUE (MISCELLANEOUS) ×1 IMPLANT
LOOPS BLUE MINI 0.8X406MM (MISCELLANEOUS) ×1
MANIFOLD NEPTUNE II (INSTRUMENTS) ×2 IMPLANT
NDL FILTER BLUNT 18X1 1/2 (NEEDLE) ×1 IMPLANT
NEEDLE FILTER BLUNT 18X 1/2SAF (NEEDLE) ×1
NEEDLE FILTER BLUNT 18X1 1/2 (NEEDLE) ×1 IMPLANT
NS IRRIG 500ML POUR BTL (IV SOLUTION) ×2 IMPLANT
PACK EXTREMITY ARMC (MISCELLANEOUS) ×2 IMPLANT
PAD CAST CTTN 4X4 STRL (SOFTGOODS) ×1 IMPLANT
PADDING CAST COTTON 4X4 STRL (SOFTGOODS) ×1
PASSER SUT SWANSON 36MM LOOP (INSTRUMENTS) ×2 IMPLANT
SPLINT CAST 1 STEP 3X12 (MISCELLANEOUS) ×2 IMPLANT
SPONGE T-LAP 18X18 ~~LOC~~+RFID (SPONGE) ×2 IMPLANT
STOCKINETTE 48X4 2 PLY STRL (GAUZE/BANDAGES/DRESSINGS) ×1 IMPLANT
STOCKINETTE BIAS CUT 4 980044 (GAUZE/BANDAGES/DRESSINGS) ×2 IMPLANT
STOCKINETTE STRL 4IN 9604848 (GAUZE/BANDAGES/DRESSINGS) ×2 IMPLANT
SUT ETHIBOND 3-0  EXTR (SUTURE) ×1
SUT ETHIBOND 3-0 EXTR (SUTURE) IMPLANT
SUT ETHILON 4-0 (SUTURE) ×1
SUT ETHILON 4-0 FS2 18XMFL BLK (SUTURE) ×1
SUT ETHILON 5-0 FS-2 18 BLK (SUTURE) ×1 IMPLANT
SUT VIC AB 2-0 SH 27 (SUTURE)
SUT VIC AB 2-0 SH 27XBRD (SUTURE) ×1 IMPLANT
SUT VIC AB 3-0 SH 27 (SUTURE) ×2
SUT VIC AB 3-0 SH 27X BRD (SUTURE) ×1 IMPLANT
SUT VIC AB 4-0 SH 27 (SUTURE) ×1
SUT VIC AB 4-0 SH 27XANBCTRL (SUTURE) ×1 IMPLANT
SUTURE ETHLN 4-0 FS2 18XMF BLK (SUTURE) ×1 IMPLANT
SYR 30ML LL (SYRINGE) ×2 IMPLANT
SYR 5ML LL (SYRINGE) ×2 IMPLANT
WATER STERILE IRR 500ML POUR (IV SOLUTION) ×1 IMPLANT
WIRE Z .062 C-WIRE SPADE TIP (WIRE) ×2 IMPLANT

## 2021-10-15 NOTE — Op Note (Signed)
10/15/2021  9:54 AM  PATIENT:  Jamie [name]    PRE-OPERATIVE DIAGNOSIS:  H68.616 Osteoarthritis of first carpometacarpal joint, left POST-OPERATIVE DIAGNOSIS:  Same  PROCEDURE:  CARPOMETACARPAL (CMC) left thumb arthroplasty using palmaris longus tendon  SURGEON:  Park Breed, MD  ANESTHESIA:   General  PREOPERATIVE INDICATIONS:  Jamie [name] is a  55 y.o. male with a diagnosis of M18 0.12 Osteoarthritis of first carpometacarpal joint, left thumb who failed conservative measures and elected for surgical management.    The risks benefits and alternatives were discussed with the patient preoperatively including but not limited to the risks of infection, bleeding, nerve injury, cardiopulmonary complications, the need for revision surgery, among others, and the patient was willing to proceed.  EBL: None   TOURNIQUET TIME: 88 MIN  OPERATIVE IMPLANTS: None  OPERATIVE FINDINGS: Advanced arthritis left thumb cmc joint  OPERATIVE PROCEDURE:  The patient was brought to the operating room and underwent satisfactory general anesthesia in the supine position.  The operative arm was prepped and draped in a sterile fashion.  The patient was noted to have an excellent palmaris longus tendon.  3 short transverse incisions were made over the course of the tendon and it was harvested under direct vision.  It was then placed in a moist sponge on the back table.  An S-shaped incision was then made over the base of the thumb metacarpal dorsally.  Dissection was carried out carefully under loupe magnification, carefully sparing the sensory nerves.  The tendon sheath around the abductor pollicis longus and extensor pollicis brevis was released under direct vision, including over the radial styloid.  Nerves were carefully spared.  Retractors were inserted and the capsule of the trapezium and metacarpal were opened longitudinally.  The radial artery and veins were carefully dissected free and retracted with  a vessel loop drain.  The capsule was dissected off the trapezium and the trapezium was then morselized with the oscillating saw and rongeur.  The flexor carpi radialis tendon was freed up in the base of the wound.  The palmaris longus tendon was passed beneath this.  A 3.5 mm drill was used to create a tunnel through the base of the metacarpal.  The tendon was then passed up through the metacarpal using a tendon passer.  The thumb had been placed in traction which was then released.  The tendon was tied upon itself and the knot was sutured to itself prevent slippage.  It was then tied in multiple knots and sutured into a ball.  This was then rotated into the space between the metacarpal and the scaphoid.  The capsule was then carefully and thoroughly, closed with 2-0 Vicryl suture.  After irrigation, the skin was closed with 4-0 nylon.  The forearm wounds had been closed with a similar suture.  A well-padded thumb spica splint was applied.  Tourniquet was deflated with good return of blood flow to the hand.  Sponge and needle counts were correct.  Patient was taken to recovery in good condition.  Park Breed, MD

## 2021-10-15 NOTE — Transfer of Care (Signed)
Immediate Anesthesia Transfer of Care Note  Patient: Jamie [name]  Procedure(s) Performed: CARPOMETACARPAL ([place]) FUSION OF THUMB (Left: Thumb)  Patient Location: PACU  Anesthesia Type:General  Level of Consciousness: awake  Airway & Oxygen Therapy: Patient Spontanous Breathing  Post-op Assessment: Report given to RN  Post vital signs: stable  Last Vitals:  Vitals Value Taken Time  BP    Temp    Pulse    Resp    SpO2      Last Pain:  Vitals:   10/15/21 0700  TempSrc: Temporal  PainSc: 0-No pain         Complications: No notable events documented.

## 2021-10-15 NOTE — Discharge Instructions (Signed)
AMBULATORY SURGERY  ?DISCHARGE INSTRUCTIONS ? ? ?The drugs that you were given will stay in your system until tomorrow so for the next 24 hours you should not: ? ?Drive an automobile ?Make any legal decisions ?Drink any alcoholic beverage ? ? ?You may resume regular meals tomorrow.  Today it is better to start with liquids and gradually work up to solid foods. ? ?You may eat anything you prefer, but it is better to start with liquids, then soup and crackers, and gradually work up to solid foods. ? ? ?Please notify your doctor immediately if you have any unusual bleeding, trouble breathing, redness and pain at the surgery site, drainage, fever, or pain not relieved by medication. ? ? ? ?Additional Instructions: ? ? ? ?Please contact your physician with any problems or Same Day Surgery at [phone], Monday through Friday 6 am to 4 pm, or [facility] number at [phone].  ?

## 2021-10-15 NOTE — Anesthesia Preprocedure Evaluation (Signed)
Anesthesia Evaluation  Patient identified by MRN, date of birth, ID band Patient awake    Reviewed: Allergy & Precautions, H&P , NPO status , Patient's Chart, lab work & pertinent test results, reviewed documented beta blocker date and time   History of Anesthesia Complications Negative for: history of anesthetic complications  Airway Mallampati: I  TM Distance: >3 FB Neck ROM: full    Dental  (+) Dental Advidsory Given, Missing, Poor Dentition, Teeth Intact   Pulmonary neg pulmonary ROS, former smoker,    Pulmonary exam normal breath sounds clear to auscultation       Cardiovascular Exercise Tolerance: Good negative cardio ROS Normal cardiovascular exam Rhythm:regular Rate:Normal     Neuro/Psych negative neurological ROS  negative psych ROS   GI/Hepatic Neg liver ROS, GERD  ,  Endo/Other  negative endocrine ROS  Renal/GU negative Renal ROS  negative genitourinary   Musculoskeletal   Abdominal   Peds  Hematology negative hematology ROS (+)   Anesthesia Other Findings Past Medical History: 12/23/1980: Accident caused by powered lawn mower 2019: Diverticulosis No date: Gout No date: Osteoarthritis   Reproductive/Obstetrics negative OB ROS                             Anesthesia Physical Anesthesia Plan  ASA: 2  Anesthesia Plan: General   Post-op Pain Management:    Induction: Intravenous  PONV Risk Score and Plan: 2 and Ondansetron, Dexamethasone, Midazolam and Treatment may vary due to age or medical condition  Airway Management Planned: LMA  Additional Equipment:   Intra-op Plan:   Post-operative Plan: Extubation in OR  Informed Consent: I have reviewed the patients History and Physical, chart, labs and discussed the procedure including the risks, benefits and alternatives for the proposed anesthesia with the patient or authorized representative who has indicated his/her  understanding and acceptance.     Dental Advisory Given  Plan Discussed with: Anesthesiologist, CRNA and Surgeon  Anesthesia Plan Comments:         Anesthesia Quick Evaluation

## 2021-10-15 NOTE — H&P (Signed)
THE PATIENT WAS SEEN PRIOR TO SURGERY TODAY.  HISTORY, ALLERGIES, HOME MEDICATIONS AND OPERATIVE PROCEDURE WERE REVIEWED. RISKS AND BENEFITS OF SURGERY DISCUSSED WITH PATIENT AGAIN.  NO CHANGES FROM INITIAL HISTORY AND PHYSICAL NOTED.    

## 2021-10-16 ENCOUNTER — Encounter: Payer: Self-pay | Admitting: Specialist

## 2021-10-16 NOTE — Anesthesia Postprocedure Evaluation (Signed)
Anesthesia Post Note  Patient: Jamie [name]  Procedure(s) Performed: CARPOMETACARPAL ([place]) FUSION OF THUMB (Left: Thumb)  Patient location during evaluation: PACU Anesthesia Type: General Level of consciousness: awake and alert Pain management: pain level controlled Vital Signs Assessment: post-procedure vital signs reviewed and stable Respiratory status: spontaneous breathing, nonlabored ventilation, respiratory function stable and patient connected to nasal cannula oxygen Cardiovascular status: blood pressure returned to baseline and stable Postop Assessment: no apparent nausea or vomiting Anesthetic complications: no   No notable events documented.   Last Vitals:  Vitals:   10/15/21 1024 10/15/21 1035  BP: 139/86 136/84  Pulse: 74 90  Resp: 16 18  Temp: 36.7 C (!) 36.3 C  SpO2: 96% 97%    Last Pain:  Vitals:   10/15/21 1035  TempSrc: Temporal  PainSc: 2                  Martha Clan

## 2022-10-13 IMAGING — CR DG ANKLE COMPLETE 3+V*R*
1 series · 3 of 3 positions shown · non-contrast
Comparison: 06/17/2016

CLINICAL DATA: Injury with pain and swelling

EXAM:
RIGHT ANKLE - COMPLETE 3+ VIEW

[Series 1: dg ankle complete right · 0.14mm/px · 3 of 3 slices shown]
[im 1/3]
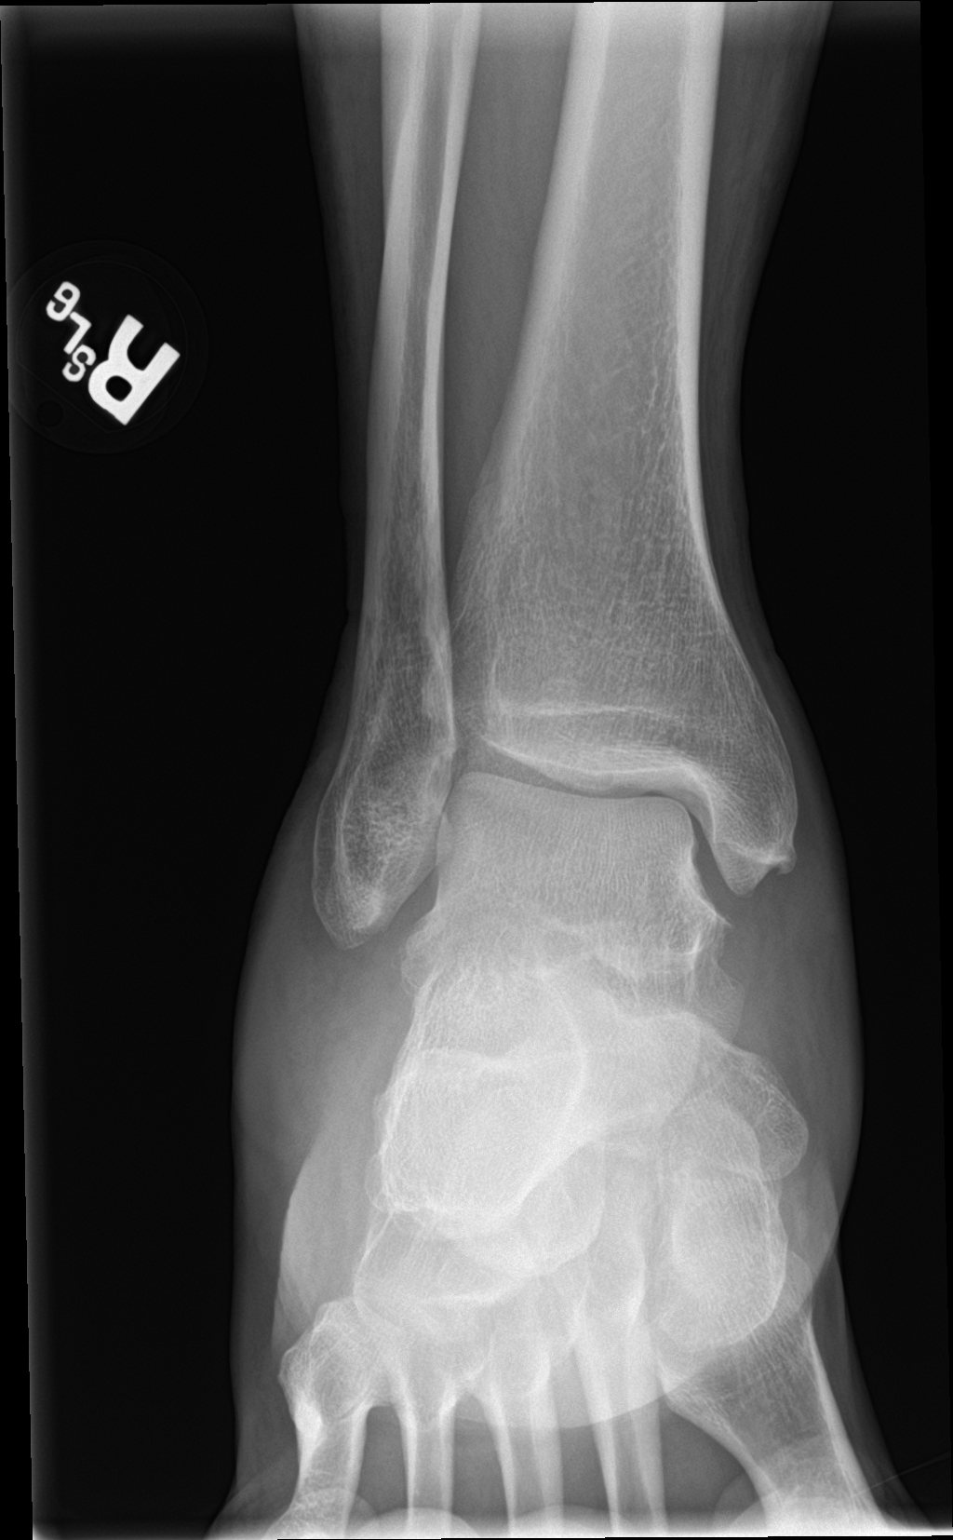
[im 2/3]
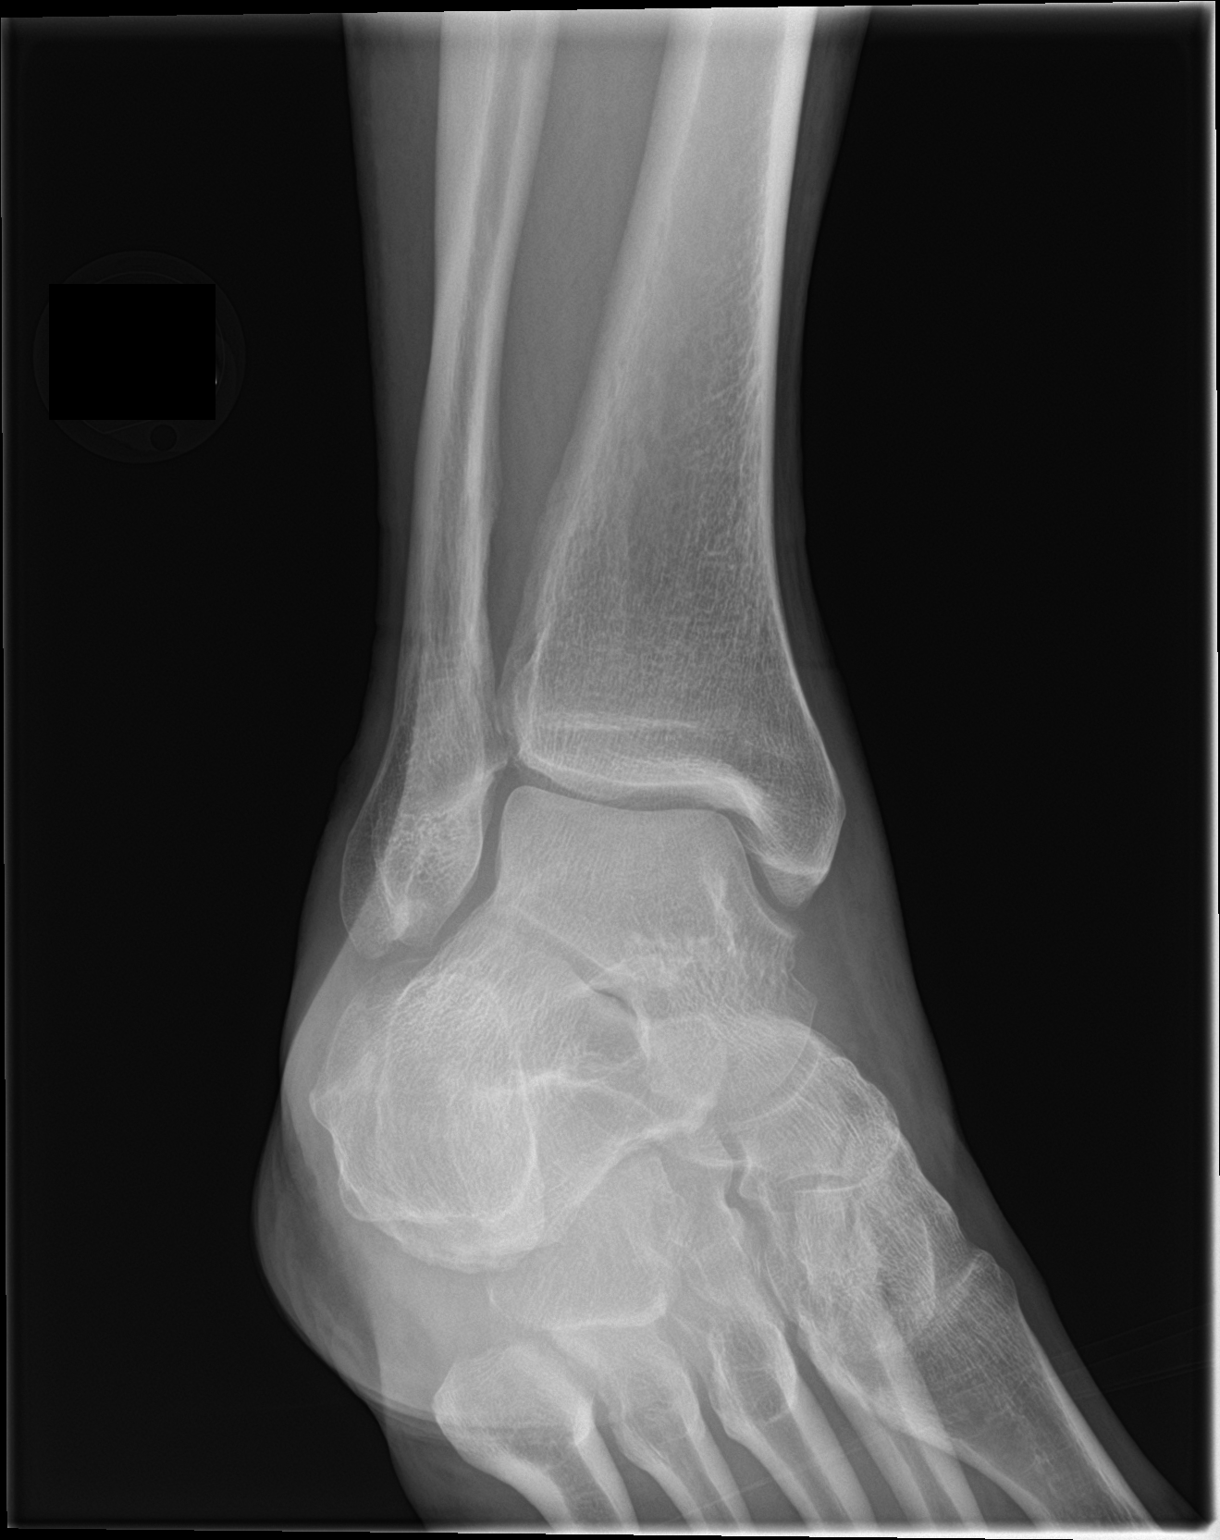
[im 3/3]
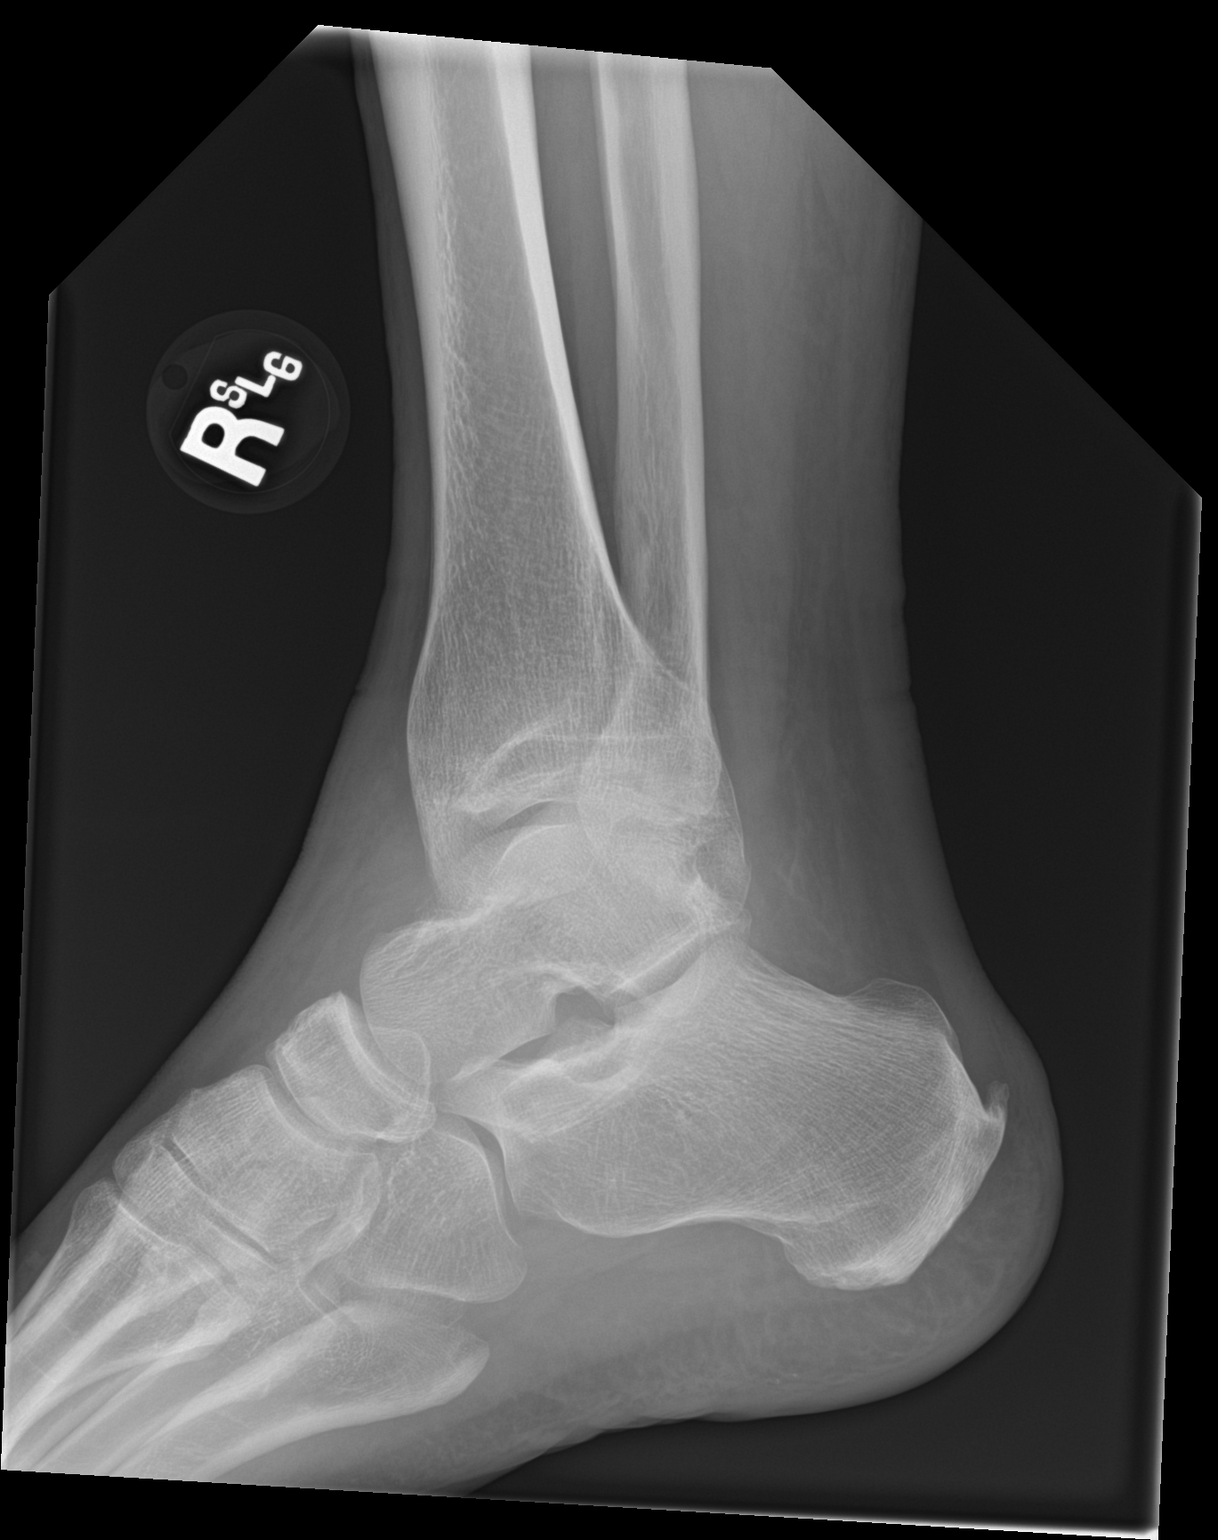

[3 of 3 positions shown; findings below may reference images not displayed]

FINDINGS: Circumferential soft tissue swelling. No visible fracture. No
dislocation. No focal bone lesion.
IMPRESSION: Circumferential soft tissue swelling. No visible fracture or
dislocation.

## 2023-02-26 ENCOUNTER — Emergency Department
Admission: EM | Admit: 2023-02-26 | Discharge: 2023-02-26 | Disposition: A | Discharge status: Home / Self Care | Payer: Commercial Managed Care - PPO | Attending: Emergency Medicine | Admitting: Emergency Medicine

## 2023-02-26 ENCOUNTER — Emergency Department: Payer: Commercial Managed Care - PPO

## 2023-02-26 ENCOUNTER — Other Ambulatory Visit: Payer: Self-pay

## 2023-02-26 DIAGNOSIS — S0266XA Fracture of symphysis of mandible, initial encounter for closed fracture: Secondary | ICD-10-CM | POA: Insufficient documentation

## 2023-02-26 DIAGNOSIS — Z23 Encounter for immunization: Secondary | ICD-10-CM | POA: Insufficient documentation

## 2023-02-26 DIAGNOSIS — K047 Periapical abscess without sinus: Secondary | ICD-10-CM | POA: Diagnosis not present

## 2023-02-26 DIAGNOSIS — W010XXA Fall on same level from slipping, tripping and stumbling without subsequent striking against object, initial encounter: Secondary | ICD-10-CM | POA: Insufficient documentation

## 2023-02-26 DIAGNOSIS — S0993XA Unspecified injury of face, initial encounter: Secondary | ICD-10-CM | POA: Diagnosis present

## 2023-02-26 MED ORDER — TETANUS-DIPHTH-ACELL PERTUSSIS 5-2.5-18.5 LF-MCG/0.5 IM SUSY
0.5000 mL | PREFILLED_SYRINGE | Freq: Once | INTRAMUSCULAR | Status: AC
  Administered 2023-02-26: 0.5 mL via INTRAMUSCULAR
  Filled 2023-02-26: qty 0.5

## 2023-02-26 MED ORDER — AMOXICILLIN-POT CLAVULANATE 875-125 MG PO TABS: 1.0000 | ORAL_TABLET | Freq: Two times a day (BID) | ORAL | 0 refills | Status: AC

## 2023-02-26 MED ORDER — AMOXICILLIN-POT CLAVULANATE 875-125 MG PO TABS
1.0000 | ORAL_TABLET | Freq: Once | ORAL | Status: AC
  Administered 2023-02-26: 1 via ORAL
  Filled 2023-02-26: qty 1

## 2023-02-26 MED ORDER — AMOXICILLIN-POT CLAVULANATE 875-125 MG PO TABS: 1.0000 | ORAL_TABLET | Freq: Two times a day (BID) | ORAL | 0 refills | Status: DC

## 2023-02-26 NOTE — ED Notes (Signed)
[place]

## 2023-02-26 NOTE — Discharge Instructions (Addendum)
You should report to Dr. Anselm Jungling at 8 AM in the morning.   You should not eat or drink anything after midnight tonight.  [facility] Department of Oral and Maxillofacial Surgery [facility]  [address] [place], [place] [address] Phone: [phone]

## 2023-02-26 NOTE — ED Provider Notes (Cosign Needed Addendum)
[facility] Emergency Department Provider Note     None    (approximate)   History   Mouth Injury   HPI  Jamie [name] is a 57 y.o. male with a history of mandible fracture with ORIF in 2003 gout, diverticulosis, and osteoarthritis, presents to the ED following mechanical fall.  Patient presents with acute malocclusion of his mandible.  He also endorses some soft tissue swelling to the gingival mucosa overlying the lower right mandibular incisors.  [place] primarily on his left side of the face.  He notes swelling and tenderness to the left face but notes difficulty closing the teeth.  He denies any head injury or LOC.  Patient endorses some mild right rib pain as well.  He presents to the ED for evaluation of his symptoms, after he reports he been unable to chew his food properly.  Patient been on a self-inflicted soft diet since the injury.  He gives remote history of surgical repair to his jaw some 20 years ago via [facility] trauma service.  Physical Exam   Triage Vital Signs: ED Triage Vitals  Enc Vitals Group     BP 02/26/23 1336 (!) 163/96     Pulse Rate 02/26/23 1336 95     Resp 02/26/23 1336 16     Temp 02/26/23 1336 98.4 F (36.9 C)     Temp Source 02/26/23 1336 Oral     SpO2 02/26/23 1336 96 %     Weight 02/26/23 1334 155 lb (70.3 kg)     Height 02/26/23 1334 '5\' 8"'$  (1.727 m)     Head Circumference --      Peak Flow --      Pain Score 02/26/23 1332 0     Pain Loc --      Pain Edu? --      Excl. in [place]? --     Most recent vital signs: Vitals:   02/26/23 1336  BP: (!) 163/96  Pulse: 95  Resp: 16  Temp: 98.4 F (36.9 C)  SpO2: 96%    General Awake, no distress. NAD HEENT NCAT. PERRL. EOMI. No rhinorrhea. Mucous membranes are moist.  Uvula is midline and tonsils are flat.  Patient with poor dentition throughout with evidence of some necrotizing gingivitis appreciated.   Patient with malocclusion of the front incisors as well as the molars bilaterally.  He also has evidence of some laxity to the right portion of the mandible, able to manipulate the lower teeth with his tongue.  There is some soft tissue swelling to the buccal mucosa in the area of the confirmed fracture.  Patient also noted to have some sublingual edema to the left of midline. CV:  Good peripheral perfusion.  RESP:  Normal effort.  ABD:  No distention.    ED Results / Procedures / Treatments   Labs (all labs ordered are listed, but only abnormal results are displayed) Labs Reviewed - No data to display   EKG   RADIOLOGY  I personally viewed and evaluated these images as part of my medical decision making, as well as reviewing the written report by the radiologist.  ED Provider Interpretation: acute mandible fracture noted.  CT MAXILLOFACIAL WO CONTRAST  Result Date: 02/26/2023 CLINICAL DATA:  Facial trauma EXAM: CT MAXILLOFACIAL WITHOUT CONTRAST TECHNIQUE: Multidetector CT imaging of the maxillofacial structures was performed. Multiplanar CT image reconstructions were also generated. RADIATION DOSE  REDUCTION: This exam was performed according to the departmental dose-optimization program which includes automated exposure control, adjustment of the mA and/or kV according to patient size and/or use of iterative reconstruction technique. COMPARISON:  None Available. FINDINGS: Osseous: Postsurgical changes from prior bilateral mandibular screw and plate fixation. There is persistent lucency across the left mandibular angle fracture, unclear if this represents healing fracture or a new fracture. There is an acute mildly displaced fracture through the fracture right parasymphyseal region of the mandible. Orbits: Negative. No traumatic or inflammatory finding. Sinuses: Mucosal thickening of the floor of bilateral maxillary sinuses. Soft tissues: Soft tissue swelling along the right aspect of the  chin. Limited intracranial: No significant or unexpected finding. IMPRESSION: 1. Acute mildly displaced fracture through the right parasymphyseal region of the mandible. 2. Postsurgical changes from prior bilateral mandibular screw and plate fixation. Persistent lucency across the left mandibular angle fracture, unclear if this represents a nonhealing fracture or a new fracture. Correlate with prior imaging, if available. 3. Soft tissue swelling along the right aspect of the chin. Electronically Signed   By: Marin Roberts M.D.   On: 02/26/2023 14:36     PROCEDURES:  Critical Care performed: Yes, see critical care procedure note(s)  Procedures   MEDICATIONS ORDERED IN ED: Medications  Tdap (BOOSTRIX) injection 0.5 mL (0.5 mLs Intramuscular Given 02/26/23 1608)  amoxicillin-clavulanate (AUGMENTIN) 875-125 MG per tablet 1 tablet (1 tablet Oral Given 02/26/23 1700)     IMPRESSION / MDM / ASSESSMENT AND PLAN / ED COURSE  I reviewed the triage vital signs and the nursing notes.                              Differential diagnosis includes, but is not limited to, mandible fracture, dental injury, dental infection, facial contusion  Patient's presentation is most consistent with acute presentation with potential threat to life or bodily function.  ----------------------------------------- 4:48 PM on 02/26/2023 ----------------------------------------- Patient with CT evidence of acute close mandible fracture.  No available on-call oral maxillofacial surgeon in the [facility], via [place].  Patient also had prior surgery at [facility] via the trauma service some 20 years ago and that is his preference at this time.  ----------------------------------------- 5:03 PM on 02/26/2023 ----------------------------------------- Spoke with Toma Deiters, DDS [facility]) he will see the patient at 8 AM in the morning at the office.  Patient should be n.p.o. after midnight tonight for surgical  intervention.  Patient's diagnosis is consistent with acute mandible fracture with what appears to be a dental infection overlying due to the patient's poor dentition.  Patient will be started on Augmentin, with initial dose given in the ED. Patient will be discharged home with prescriptions for Augmentin. Patient is to follow up with [facility] oral and maxillofacial surgery tomorrow morning as scheduled. Patient is given ED precautions to return to the ED for any worsening or new symptoms.   FINAL CLINICAL IMPRESSION(S) / ED DIAGNOSES   Final diagnoses:  Closed fracture of symphysis of mandible, initial encounter [facility])  Dental abscess     Rx / DC Orders   ED Discharge Orders          Ordered    amoxicillin-clavulanate (AUGMENTIN) 875-125 MG tablet  2 times daily,   Status:  Discontinued        02/26/23 1702    amoxicillin-clavulanate (AUGMENTIN) 875-125 MG tablet  2 times daily  02/26/23 1733             Note:  This document was prepared using Dragon voice recognition software and may include unintentional dictation errors.    Melvenia Needles, PA-C 02/26/23 2345    Melvenia Needles, PA-C 02/26/23 2349    Delman Kitten, MD 02/27/23 1233

## 2023-02-26 NOTE — ED Provider Notes (Signed)
Vitals:   02/26/23 1336  BP: (!) 163/96  Pulse: 95  Resp: 16  Temp: 98.4 F (36.9 C)  SpO2: 96%     Patient fully alert and oriented.  Atraumatic with exception to swallow light swelling of the lower jaw.  On exam he is not able to fully close his mouth, and his lower teeth are out of alignment.  He is alert and oriented able to swallow.  Reports has been able to drink and stay hydrated but not a eat anything solid     Current plan of care is to consult with oral maxillofacial surgery at [facility] regarding imaging findings and development of plan.  I do have mild concern for surrounding gingival inflammation and slight fullness in the area of the fracture.  Difficult to exclude developing dental infection or infection near the site of his fracture/early abscess formation.  Patient reports his previous jaw surgery was at [facility] 20 years ago.  He is agreeable with [place] contacting [facility] to develop a plan of care for this evening  Medical screening examination/treatment/procedure(s) were conducted as a shared visit with non-physician practitioner(s) and myself.  I personally evaluated the patient during the encounter.    Delman Kitten, MD 02/26/23 [phone]

## 2023-02-26 NOTE — ED Triage Notes (Signed)
Pt to ED for L lower jaw swelling and pain. Pt has hx broken jaw to same side, 4d ago [place] in [place] and fell on injured jaw again. L jaw appears swollen. Pt states has pain with chewing.

## 2023-11-18 ENCOUNTER — Other Ambulatory Visit: Payer: Self-pay

## 2023-11-18 ENCOUNTER — Emergency Department
Admission: EM | Admit: 2023-11-18 | Discharge: 2023-11-18 | Disposition: A | Discharge status: Home / Self Care | Payer: Worker's Compensation | Attending: Emergency Medicine | Admitting: Emergency Medicine

## 2023-11-18 ENCOUNTER — Emergency Department: Payer: Worker's Compensation

## 2023-11-18 DIAGNOSIS — Y990 Civilian activity done for income or pay: Secondary | ICD-10-CM | POA: Insufficient documentation

## 2023-11-18 DIAGNOSIS — S0990XA Unspecified injury of head, initial encounter: Secondary | ICD-10-CM | POA: Diagnosis present

## 2023-11-18 DIAGNOSIS — Y92512 Supermarket, store or market as the place of occurrence of the external cause: Secondary | ICD-10-CM | POA: Diagnosis not present

## 2023-11-18 DIAGNOSIS — W208XXA Other cause of strike by thrown, projected or falling object, initial encounter: Secondary | ICD-10-CM | POA: Insufficient documentation

## 2023-11-18 DIAGNOSIS — S0101XA Laceration without foreign body of scalp, initial encounter: Secondary | ICD-10-CM | POA: Insufficient documentation

## 2023-11-18 NOTE — ED Provider Notes (Signed)
[facility] Provider Note    Event Date/Time   First MD Initiated Contact with Patient 11/18/23 1557     (approximate)  History   Chief Complaint: Head Injury  HPI  Jamie [name] is a 57 y.o. male who presents to the emergency department after head injury.  According to the patient he was working at the [employer] when a pallet fell over hitting the patient on the top of the head.  No LOC.  No vomiting.  Injury occurred approximately 4+ hours ago.  Patient did have bleeding of the scalp but that has since resolved.  Physical Exam   Triage Vital Signs: ED Triage Vitals  Encounter Vitals Group     BP 11/18/23 1338 (!) 186/89     Systolic BP Percentile --      Diastolic BP Percentile --      Pulse Rate 11/18/23 1338 76     Resp 11/18/23 1338 18     Temp 11/18/23 1338 98 F (36.7 C)     Temp src --      SpO2 11/18/23 1338 96 %     Weight 11/18/23 1339 165 lb (74.8 kg)     Height 11/18/23 1339 5\' 8"  (1.727 m)     Head Circumference --      Peak Flow --      Pain Score 11/18/23 1339 1     Pain Loc --      Pain Education --      Exclude from Growth Chart --     Most recent vital signs: Vitals:   11/18/23 1338 11/18/23 1608  BP: (!) 186/89 (!) 178/80  Pulse: 76 80  Resp: 18 18  Temp: 98 F (36.7 C)   SpO2: 96% 97%    General: Awake, no distress.  CV:  Good peripheral perfusion.  Regular rate and rhythm  Resp:  Normal effort.  Equal breath sounds bilaterally.  Abd:  No distention.  Other:  2.5 cm laceration to the top of the scalp, hemostatic.   ED Results / Procedures / Treatments    RADIOLOGY  I reviewed and interpreted CT head images.  No large bleed seen on my evaluation. Radiology has read the CT scan as negative for acute abnormality.   MEDICATIONS ORDERED IN ED: Medications - No data to display   IMPRESSION / MDM / ASSESSMENT AND PLAN / ED COURSE  I reviewed the triage vital signs and the nursing  notes.  Patient's presentation is most consistent with acute presentation with potential threat to life or bodily function.  Patient presents to the emergency department after a head injury.  Reassuringly CT scan of the head is negative.  Patient does have a 2 to 3 cm laceration to the top of the scalp.  I have cleaned and irrigated the wound, repaired with 2 staples.  Patient will follow-up with his doctor for staple removal in 10 days.  Patient states his last tetanus shot was in June during his annual physical.  Overall patient appears well.  Will discharge home with PCP follow-up.  LACERATION REPAIR Performed by: Minna Antis Authorized by: Minna Antis Consent: Verbal consent obtained. Risks and benefits: risks, benefits and alternatives were discussed Consent given by: patient Patient identity confirmed: provided demographic data Prepped and Draped in normal sterile fashion Wound explored  Laceration Location: Scalp  Laceration Length: 2.5 cm  No Foreign Bodies seen or palpated  Irrigated and cleaned Amount of cleaning: standard  Skin closure: Staples  Number of staples: 2  Patient tolerance: Patient tolerated the procedure well with no immediate complications.   FINAL CLINICAL IMPRESSION(S) / ED DIAGNOSES   Head injury   Note:  This document was prepared using Dragon voice recognition software and may include unintentional dictation errors.   Minna Antis, MD 11/18/23 [phone]

## 2023-11-18 NOTE — ED Notes (Signed)
See triage note  Presents with head injury from work  States he had a crate of lettuce hit the top of his head  No LOC  Small laceration note to top of head

## 2023-11-18 NOTE — Discharge Instructions (Signed)
Your CT scan of the head is reassuring.  Please follow-up with your doctor in 10 days for staple removal.  Return to the emergency department for any signs of infection such as increased pain fever or pus.

## 2023-11-18 NOTE — ED Triage Notes (Signed)
Pt to ED for box hitting him on head today at work. Works Statistician. Denies LOC or blood thinners. Laceration noted to top of head with swelling. Bleeding controlled.

## 2024-01-15 ENCOUNTER — Ambulatory Visit
Admission: EM | Admit: 2024-01-15 | Discharge: 2024-01-15 | Disposition: A | Discharge status: Home / Self Care | Payer: BC Managed Care – PPO | Attending: Emergency Medicine | Admitting: Emergency Medicine

## 2024-01-15 DIAGNOSIS — J069 Acute upper respiratory infection, unspecified: Secondary | ICD-10-CM

## 2024-01-15 LAB — GROUP A STREP BY PCR: Group A Strep by PCR: NOT DETECTED

## 2024-01-15 NOTE — Discharge Instructions (Signed)
Your strep test is negative, Most likely you have a viral illness: no antibiotic is indicated at this time, May treat with OTC meds of choice(chloraseptic throat lozenges, tylenol,ibuprofen, etc). Make sure to drink plenty of fluids to stay hydrated(gatorade, water, popsicles,jello,etc), avoid caffeine products. Follow up with PCP. Return as needed.

## 2024-01-15 NOTE — ED Triage Notes (Signed)
Pt declined being swabbed for covid and flu.   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  Pt states that he has been hospitalized for strep before.

## 2024-01-15 NOTE — ED Triage Notes (Signed)
Pt c/o cough, body aches, sore throat,  Pt states that it hurts when he swallows.

## 2024-01-15 NOTE — ED Provider Notes (Signed)
[facility]    CSN: [id number] Arrival date & time: 01/15/24  1308      History   Chief Complaint No chief complaint on file.   HPI Jamie [name] is a 58 y.o. male.   58 year old male pt, Jamie [name], presents to urgent care for evaluation of cough, body aches, sore throat for 2 days.  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  The history is provided by the patient. No language interpreter was used.    Past Medical History:  Diagnosis Date   Accident caused by powered lawn mower 12/23/1980   Diverticulosis 2019   Gout    Osteoarthritis     Patient Active Problem List   Diagnosis Date Noted   Viral URI 01/15/2024   Axillary mass 03/31/2015    Past Surgical History:  Procedure Laterality Date   AMPUTATION TOE Right 1980   partial great toe and second toe; lawn mower accident   CARPOMETACARPAL (CMC) FUSION OF THUMB Left 10/15/2021   Procedure: CARPOMETACARPAL (CMC) FUSION OF THUMB;  Surgeon: Deeann Saint, MD;  Location: [facility] ORS;  Service: Orthopedics;  Laterality: Left;   COLONOSCOPY     2008, 2019   excision right arm mass  04/04/2015   lipoma   MANDIBLE FRACTURE SURGERY  12/23/2001       Home Medications    Prior to Admission medications   Medication Sig Start Date End Date Taking? Authorizing Provider  allopurinol (ZYLOPRIM) 100 MG tablet Take 400 mg by mouth daily.  03/13/15  Yes [provider]  gabapentin (NEURONTIN) 400 MG capsule Take 1 capsule (400 mg total) by mouth 3 (three) times daily. 10/15/21   Deeann Saint, MD  HYDROcodone-acetaminophen (NORCO) 5-325 MG tablet Take 1-2 tablets by mouth every 6 (six) hours as needed. 10/15/21   Deeann Saint, MD  meloxicam (MOBIC) 15 MG tablet Take 15 mg by mouth daily.    [provider]    Family History Family History  Problem Relation Age of Onset   Cancer  Father        colon    Social History Social History   Tobacco Use   Smoking status: Former    Current packs/day: 0.00    Types: Cigarettes    Start date: 12/23/1990    Quit date: 12/24/1991    Years since quitting: 32.0   Smokeless tobacco: Never  Vaping Use   Vaping status: Never Used  Substance Use Topics   Alcohol use: Yes    Comment: 6/day beer   Drug use: No     Allergies   Patient has no known allergies.   Review of Systems Review of Systems  Constitutional:  Positive for fever.  HENT:  Positive for sore throat.   Respiratory:  Positive for cough.   Musculoskeletal:  Positive for myalgias.  All other systems reviewed and are negative.    Physical Exam Triage Vital Signs ED Triage Vitals  Encounter Vitals Group     BP 01/15/24 0957 (!) 153/93     Systolic BP Percentile --      Diastolic BP Percentile --      Pulse Rate 01/15/24 0957 88     Resp --      Temp 01/15/24 0957 99.3 F (37.4 C)     Temp Source 01/15/24 0957 Oral     SpO2 01/15/24 0957 98 %  Weight 01/15/24 0956 158 lb (71.7 kg)     Height 01/15/24 0956 5\' 8"  (1.727 m)     Head Circumference --      Peak Flow --      Pain Score 01/15/24 0956 8     Pain Loc --      Pain Education --      Exclude from Growth Chart --    No data found.  Updated Vital Signs BP (!) 153/93 (BP Location: Left Arm)   Pulse 88   Temp 99.3 F (37.4 C) (Oral)   Ht 5\' 8"  (1.727 m)   Wt 158 lb (71.7 kg)   SpO2 98%   BMI 24.02 kg/m   Visual Acuity Right Eye Distance:   Left Eye Distance:   Bilateral Distance:    Right Eye Near:   Left Eye Near:    Bilateral Near:     Physical Exam Vitals and nursing note reviewed.  Constitutional:      General: He is not in acute distress.    Appearance: He is well-developed. He is not ill-appearing or toxic-appearing.  HENT:     Head: Normocephalic.     Right Ear: Tympanic membrane is retracted.     Left Ear: Tympanic membrane is retracted.     Nose: Mucosal  edema and congestion present.     Mouth/Throat:     Mouth: Mucous membranes are moist.     Pharynx: Uvula midline.  Eyes:     General: Lids are normal.     Conjunctiva/sclera: Conjunctivae normal.     Pupils: Pupils are equal, round, and reactive to light.  Cardiovascular:     Rate and Rhythm: Normal rate and regular rhythm.     Pulses: Normal pulses.     Heart sounds: Normal heart sounds.  Pulmonary:     Effort: Pulmonary effort is normal. No respiratory distress.     Breath sounds: Normal breath sounds and air entry. No decreased breath sounds or wheezing.  Abdominal:     General: There is no distension.     Palpations: Abdomen is soft.  Musculoskeletal:        General: Normal range of motion.     Cervical back: Normal range of motion.  Skin:    General: Skin is warm and dry.     Findings: No rash.  Neurological:     General: No focal deficit present.     Mental Status: He is alert and oriented to person, place, and time.     GCS: GCS eye subscore is 4. GCS verbal subscore is 5. GCS motor subscore is 6.     Cranial Nerves: No cranial nerve deficit.     Sensory: No sensory deficit.  Psychiatric:        Attention and Perception: Attention normal.        Mood and Affect: Mood normal.        Speech: Speech normal.        Behavior: Behavior normal. Behavior is cooperative.      UC Treatments / Results  Labs (all labs ordered are listed, but only abnormal results are displayed) Labs Reviewed  GROUP A STREP BY PCR    EKG   Radiology No results found.  Procedures Procedures (including critical care time)  Medications Ordered in UC Medications - No data to display  Initial Impression / Assessment and Plan / UC Course  I have reviewed the triage vital signs and the nursing notes.  Pertinent labs &  imaging results that were available during my care of the patient were reviewed by me and considered in my medical decision making (see chart for details).  Clinical  Course as of 01/15/24 1925  Thu Jan 15, 2024  1033 Strep is negative [JD]    Clinical Course User Index [JD] Migel Hannis, Para March, NP   Discussed exam findings and plan of care with patient, strict go to ER precautions given.   Patient verbalized understanding to this provider.  Ddx: Viral URI, allergies Final Clinical Impressions(s) / UC Diagnoses   Final diagnoses:  Viral URI     Discharge Instructions      Your strep test is negative, Most likely you have a viral illness: no antibiotic is indicated at this time, May treat with OTC meds of choice(chloraseptic throat lozenges, tylenol,ibuprofen, etc). Make sure to drink plenty of fluids to stay hydrated(gatorade, water, popsicles,jello,etc), avoid caffeine products. Follow up with PCP. Return as needed.     ED Prescriptions   None    PDMP not reviewed this encounter.   Clancy Gourd, NP 01/15/24 1925

## 2024-08-13 ENCOUNTER — Other Ambulatory Visit: Payer: Self-pay | Admitting: Internal Medicine

## 2024-08-13 DIAGNOSIS — R748 Abnormal levels of other serum enzymes: Secondary | ICD-10-CM

## 2024-08-20 ENCOUNTER — Ambulatory Visit
Admission: RE | Admit: 2024-08-20 | Discharge: 2024-08-20 | Disposition: A | Discharge status: Home / Self Care | Source: Ambulatory Visit | Attending: Internal Medicine | Admitting: Internal Medicine

## 2024-08-20 DIAGNOSIS — R748 Abnormal levels of other serum enzymes: Secondary | ICD-10-CM | POA: Insufficient documentation

## 2024-08-30 ENCOUNTER — Other Ambulatory Visit: Payer: Self-pay | Admitting: Internal Medicine

## 2024-08-30 ENCOUNTER — Inpatient Hospital Stay: Attending: Oncology | Admitting: Oncology

## 2024-08-30 ENCOUNTER — Inpatient Hospital Stay

## 2024-08-30 ENCOUNTER — Encounter: Payer: Self-pay | Admitting: Oncology

## 2024-08-30 DIAGNOSIS — R160 Hepatomegaly, not elsewhere classified: Secondary | ICD-10-CM | POA: Insufficient documentation

## 2024-08-30 DIAGNOSIS — K7689 Other specified diseases of liver: Secondary | ICD-10-CM

## 2024-08-30 DIAGNOSIS — Z87891 Personal history of nicotine dependence: Secondary | ICD-10-CM | POA: Insufficient documentation

## 2024-08-30 DIAGNOSIS — K769 Liver disease, unspecified: Secondary | ICD-10-CM | POA: Diagnosis not present

## 2024-08-30 DIAGNOSIS — B1920 Unspecified viral hepatitis C without hepatic coma: Secondary | ICD-10-CM | POA: Diagnosis not present

## 2024-08-30 DIAGNOSIS — R5383 Other fatigue: Secondary | ICD-10-CM | POA: Diagnosis not present

## 2024-08-30 DIAGNOSIS — F1090 Alcohol use, unspecified, uncomplicated: Secondary | ICD-10-CM | POA: Insufficient documentation

## 2024-08-30 LAB — HEPATIC FUNCTION PANEL
ALT: 44 U/L (ref 0–44)
AST: 71 U/L — ABNORMAL HIGH (ref 15–41)
Albumin: 3.8 g/dL (ref 3.5–5.0)
Alkaline Phosphatase: 58 U/L (ref 38–126)
Bilirubin, Direct: 0.2 mg/dL (ref 0.0–0.2)
Indirect Bilirubin: 0.6 mg/dL (ref 0.3–0.9)
Total Bilirubin: 0.8 mg/dL (ref 0.0–1.2)
Total Protein: 7.9 g/dL (ref 6.5–8.1)

## 2024-08-30 LAB — FERRITIN: Ferritin: 192 ng/mL (ref 24–336)

## 2024-08-30 LAB — APTT: aPTT: 34 s (ref 24–36)

## 2024-08-30 LAB — PROTIME-INR
INR: 0.9 (ref 0.8–1.2)
Prothrombin Time: 13.1 s (ref 11.4–15.2)

## 2024-08-30 LAB — IRON AND TIBC
Iron: 201 ug/dL — ABNORMAL HIGH (ref 45–182)
Saturation Ratios: 68 % — ABNORMAL HIGH (ref 17.9–39.5)
TIBC: 294 ug/dL (ref 250–450)
UIBC: 93 ug/dL

## 2024-08-30 NOTE — Assessment & Plan Note (Signed)
 Check AFP.  Check MRI of liver with and without contrast

## 2024-08-30 NOTE — Assessment & Plan Note (Addendum)
 Urine porphyrin results were reviewed and discussed with patient.  He does not have abdominal pain  Consistent with hopefully cutaneous tarda,  acquired vs inherited susceptibility factors Patient has a few acquired factors-alcohol use, positive HCV, check inherited factors i.e. hemochromatosis evaluation. UROD genetic testing not available at [facility].  Patient will has skin manifestations will need treatment.  Elevated iron saturation..  Consider phlebotomy versus low-dose hydrochloric .  Consider retina examination baseline. Will further discuss with patient at next visit.

## 2024-08-30 NOTE — Assessment & Plan Note (Signed)
 Recommend alcohol cessation.

## 2024-08-30 NOTE — Progress Notes (Signed)
 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  He does not have abdominal pain  Consistent with hopefully cutaneous tarda,  acquired vs inherited susceptibility factors Patient has a few acquired factors-alcohol use, positive HCV, check inherited factors i.e. hemochromatosis evaluation. UROD genetic testing not available at [facility].  Patient will has skin manifestations will need treatment.  Elevated iron saturation..  Consider phlebotomy versus low-dose hydrochloric .  Consider retina examination baseline. Will further discuss with patient at next visit.  Liver mass Check AFP.  Check MRI of liver with and without contrast  Alcohol use Recommend alcohol cessation   Orders Placed This Encounter  Procedures   MR Abdomen W Wo Contrast    Standing Status:   Future    Expiration Date:   08/30/2025    If indicated for the ordered procedure, I authorize the administration of contrast media per Radiology protocol:   Yes    What is the patient's sedation requirement?:   No Sedation    Does the patient have a pacemaker or implanted devices?:   No    Preferred imaging location?:   [facility] (table limit - 500lbs)   Iron and TIBC    Standing Status:   Future    Number of Occurrences:   1    Expected Date:   08/30/2024    Expiration Date:   11/28/2024   Ferritin    Standing Status:   Future    Number of Occurrences:   1    Expected Date:   08/30/2024    Expiration Date:   11/28/2024   Protime-INR    Standing Status:   Future    Number of Occurrences:   1    Expected Date:   08/30/2024    Expiration Date:   11/28/2024   Hemochromatosis DNA-PCR(c282y,h63d)    Standing Status:   Future    Number of  Occurrences:   1    Expected Date:   08/30/2024    Expiration Date:   11/28/2024   Porphyrins, fractionation-plasma    Standing Status:   Future    Number of Occurrences:   1    Expected Date:   08/30/2024    Expiration Date:   11/28/2024   ALA Delta, random urine    Standing Status:   Future    Number of Occurrences:   1    Expected Date:   08/30/2024    Expiration Date:   11/28/2024   AFP tumor marker    Standing Status:   Future    Number of Occurrences:   1    Expected Date:   08/30/2024    Expiration Date:   11/28/2024   Hepatic function panel    Standing Status:   Future    Number of Occurrences:   1    Expected Date:   08/30/2024    Expiration Date:   08/30/2025   APTT    Standing Status:   Future    Number of Occurrences:   1    Expected Date:   08/30/2024    Expiration Date:   08/30/2025   Miscellaneous [facility] test (send-out)    Standing Status:   Future    Number of Occurrences:  1    Expected Date:   08/30/2024    Expiration Date:   08/30/2025    Test name / description::   [id number] [facility]   Follow-up in a few weeks All questions were answered. The patient knows to call the clinic with any problems, questions or concerns.  Jamie Cap, MD, PhD [facility] Hematology Oncology 08/30/2024   HISTORY OF PRESENTING ILLNESS:   Jamie [name] is a  58 y.o.  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  Discussed the use of AI scribe software for clinical note transcription with the patient, who gave verbal consent to proceed.   He has been experiencing recurrent vesicular eruptions on his hands for at least two years. These blisters are pruritic, leading to excoriation, scabbing, and subsequent scarring. Once one blister resolves, another appears elsewhere on his hands. There is no exacerbation of the blisters with sun exposure. He describes his skin as fragile, tearing easily with minor  contact.  He has undergone prior workup  08/16/2024, Uroporphyrins 671 ug/L Heptacarboxyl  471 ug/L Hexacarboxyl  4 ug/L Pentacarboxyl 35 ug/Lug/L Coproporphyrin I 33 ug/L Coproporphyrin III 70 ug/L   Previous blood work showed positive hepatitis C antibody test but no active infection.  Negative for HIV.  Hepatitis B core antibody positive, IgM antibody negative. He is unsure of how he might have been exposed to hepatitis. stating 'I've done a lot of things in my life.  He consumes alcohol, typically two to three beers a day, but is attempting to reduce his intake, especially during workdays.  08/20/2024, right upper quadrant ultrasound showed 4.8 cm mass with a right liver.  Recommend further evaluation.  Patient denies abdominal pain, bleeding.  MEDICAL HISTORY:  Past Medical History:  Diagnosis Date   Accident caused by powered lawn mower 12/23/1980   Diverticulosis 2019   Gout    Osteoarthritis     SURGICAL HISTORY: Past Surgical History:  Procedure Laterality Date   AMPUTATION TOE Right 1980   partial great toe and second toe; lawn mower accident   CARPOMETACARPAL (CMC) FUSION OF THUMB Left 10/15/2021   Procedure: CARPOMETACARPAL (CMC) FUSION OF THUMB;  Surgeon: Cleotilde Barrio, MD;  Location: [facility] ORS;  Service: Orthopedics;  Laterality: Left;   COLONOSCOPY     2008, 2019   excision right arm mass  04/04/2015   lipoma   MANDIBLE FRACTURE SURGERY  12/23/2001    SOCIAL HISTORY: Social History   Socioeconomic History   Marital status: Single    Spouse name: Not on file   Number of children: Not on file   Years of education: Not on file   Highest education level: Not on file  Occupational History   Not on file  Tobacco Use   Smoking status: Former    Current packs/day: 0.00    Types: Cigarettes    Start date: 12/23/1990    Quit date: 12/24/1991    Years since quitting: 32.7   Smokeless tobacco: Never  Vaping Use   Vaping status: Never Used  Substance and  Sexual Activity   Alcohol use: Yes    Comment: 12 beers weekly   Drug use: No   Sexual activity: Not on file  Other Topics Concern   Not on file  Social History Narrative   Not on file   Social Drivers of Health   Financial Resource Strain: Low Risk  (  08/11/2024)   Received from [facility]   Overall Financial Resource Strain (CARDIA)    Difficulty of Paying Living Expenses: Not very hard  Food Insecurity: No Food Insecurity (08/30/2024)   Hunger Vital Sign    Worried About Running Out of Food in the Last Year: Never true    Ran Out of Food in the Last Year: Never true  Transportation Needs: No Transportation Needs (08/30/2024)   PRAPARE - Administrator, Civil Service (Medical): No    Lack of Transportation (Non-Medical): No  Physical Activity: Not on file  Stress: Not on file  Social Connections: Not on file  Intimate Partner Violence: Not At Risk (08/30/2024)   Humiliation, Afraid, Rape, and Kick questionnaire    Fear of Current or Ex-Partner: No    Emotionally Abused: No    Physically Abused: No    Sexually Abused: No    FAMILY HISTORY: Family History  Problem Relation Age of Onset   Hypertension Mother    Cancer Father        colon   Colon cancer Father     ALLERGIES:  has no known allergies.  MEDICATIONS:  Current Outpatient Medications  Medication Sig Dispense Refill   allopurinol (ZYLOPRIM) 100 MG tablet Take 400 mg by mouth daily.   2   gabapentin  (NEURONTIN ) 400 MG capsule Take 1 capsule (400 mg total) by mouth 3 (three) times daily. 60 capsule 3   HYDROcodone -acetaminophen  (NORCO) 5-325 MG tablet Take 1-2 tablets by mouth every 6 (six) hours as needed. 50 tablet 0   meloxicam  (MOBIC ) 15 MG tablet Take 15 mg by mouth daily.     No current facility-administered medications for this visit.    Review of Systems  Constitutional:  Positive for fatigue. Negative for appetite change, chills, fever and unexpected weight change.  HENT:    Negative for hearing loss and voice change.   Eyes:  Negative for eye problems and icterus.  Respiratory:  Negative for chest tightness, cough and shortness of breath.   Cardiovascular:  Negative for chest pain and leg swelling.  Gastrointestinal:  Negative for abdominal distention and abdominal pain.  Endocrine: Negative for hot flashes.  Genitourinary:  Negative for difficulty urinating, dysuria and frequency.   Musculoskeletal:  Negative for arthralgias.  Skin:  Negative for itching and rash.       Blisters on bilateral hands.  Neurological:  Negative for light-headedness and numbness.  Hematological:  Negative for adenopathy. Does not bruise/bleed easily.  Psychiatric/Behavioral:  Negative for confusion.    PHYSICAL EXAMINATION:  Vitals:   08/30/24 1512 08/30/24 1517  BP: (!) 149/81 133/76  Pulse: (!) 58   Resp: 18   Temp: (!) 97.5 F (36.4 C)    Filed Weights   08/30/24 1512  Weight: 161 lb 14.4 oz (73.4 kg)    Physical Exam Constitutional:      General: He is not in acute distress. HENT:     Head: Normocephalic and atraumatic.  Eyes:     General: No scleral icterus. Cardiovascular:     Rate and Rhythm: Normal rate and regular rhythm.     Heart sounds: Normal heart sounds.  Pulmonary:     Effort: Pulmonary effort is normal. No respiratory distress.     Breath sounds: No wheezing.  Abdominal:     General: Bowel sounds are normal. There is no distension.     Palpations: Abdomen is soft.  Musculoskeletal:  General: No deformity. Normal range of motion.     Cervical back: Normal range of motion and neck supple.  Skin:    General: Skin is warm and dry.     Findings: Lesion present. No erythema or rash.  Neurological:     Mental Status: He is alert and oriented to person, place, and time. Mental status is at baseline.     Cranial Nerves: No cranial nerve deficit.     Coordination: Coordination normal.  Psychiatric:        Mood and Affect: Mood normal.      LABORATORY DATA:  I have reviewed the data as listed    Latest Ref Rng & Units 04/04/2015    9:42 AM  CBC  WBC 3.8 - 10.6 x10 3/mm 3 9.1   Hemoglobin 13.0 - 18.0 g/dL 84.3   Hematocrit 59.9 - 52.0 % 48.0   Platelets 150 - 440 x10 3/mm 3 194       Latest Ref Rng & Units 08/30/2024    3:35 PM  CMP  Total Protein 6.5 - 8.1 g/dL 7.9   Total Bilirubin 0.0 - 1.2 mg/dL 0.8   Alkaline Phos 38 - 126 U/L 58   AST 15 - 41 U/L 71   ALT 0 - 44 U/L 44       RADIOGRAPHIC STUDIES: I have personally reviewed the radiological images as listed and agreed with the findings in the report. US  Abdomen Limited RUQ (LIVER/GB) Result Date: 08/25/2024 CLINICAL DATA:  elevated liver enzymes EXAM: ULTRASOUND ABDOMEN LIMITED RIGHT UPPER QUADRANT COMPARISON:  None Available. FINDINGS: Gallbladder: No gallstones or wall thickening visualized. No sonographic Murphy sign noted by sonographer. Common bile duct: Diameter: Visualized portion measures 3 mm, within normal limits. Liver: There is a heterogeneous possible mass within the RIGHT liver which measures 4.8 x 4.0 x 4.4 cm. Within normal limits in parenchymal echogenicity. Portal vein is patent on color Doppler imaging with normal direction of blood flow towards the liver. Other: None. IMPRESSION: There is a possible 4.8 cm mass within the RIGHT liver. Recommend further evaluation with dedicated liver protocol MRI with and without contrast. These results will be called to the ordering clinician or representative by the Radiologist Assistant, and communication documented in the PACS or [employer]. Electronically Signed   By: Corean Salter M.D.   On: 08/25/2024 14:30

## 2024-08-31 LAB — AFP TUMOR MARKER: AFP, Serum, Tumor Marker: 5.4 ng/mL (ref 0.0–8.4)

## 2024-09-01 ENCOUNTER — Telehealth: Payer: Self-pay

## 2024-09-01 NOTE — Telephone Encounter (Signed)
 Per [facility], Dr. Fernande also ordered MRI that has been approved by [facility]. MRI at [facility] has been cancelled.   Dr. Babara ok with moving forward with Auth from Dr. Celine office. Mychart message sent to pt to let him know about MRI cancellation and that he can call [facility] to set up appt since auth has already been received.

## 2024-09-01 NOTE — Telephone Encounter (Signed)
 Called and talked to pt. Informed him of MRI cancellation. Gave him [facility] number so he can call and get MRI scheduled. Pt verbalized understanding said he would call today.

## 2024-09-02 ENCOUNTER — Ambulatory Visit

## 2024-09-02 LAB — MISC LABCORP TEST (SEND OUT): Labcorp test code: 7364

## 2024-09-02 NOTE — Telephone Encounter (Signed)
MRI scheduled on 9/18.

## 2024-09-06 LAB — HEMOCHROMATOSIS DNA-PCR(C282Y,H63D)

## 2024-09-09 ENCOUNTER — Other Ambulatory Visit

## 2024-09-09 LAB — PORPHYRINS, FRACTIONATION-PLASMA
Coproporphyrin.: <1 ug/dL (ref <1.0)
Heptacarboxyl Porphyrins: 6.9 ug/dL — ABNORMAL HIGH (ref <1.0)
Hexacarboxyl Porphyrins: 1.7 ug/dL — ABNORMAL HIGH (ref <1.5)
Pentacarboxyl Porphyrins: <1 ug/dL (ref <1.0)
Protoporphyrin: <1.5 ug/dL (ref <1.5)
Uroporphyrin: 6.3 ug/dL — ABNORMAL HIGH (ref <1.5)

## 2024-09-14 ENCOUNTER — Other Ambulatory Visit: Payer: Self-pay | Admitting: Oncology

## 2024-09-14 ENCOUNTER — Ambulatory Visit: Payer: Self-pay | Admitting: Oncology

## 2024-09-15 NOTE — Telephone Encounter (Signed)
-----   Message from Zelphia Cap sent at 09/14/2024  5:21 PM EDT ----- Please add phlebotomy to his next visit thanks.  ----- Message ----- From: Rebecka, [facility] [place] Sent: 08/30/2024   4:35 PM EDT To: Zelphia Cap, MD

## 2024-09-22 ENCOUNTER — Encounter: Payer: Self-pay | Admitting: Oncology

## 2024-09-22 ENCOUNTER — Ambulatory Visit
Admission: RE | Admit: 2024-09-22 | Discharge: 2024-09-22 | Disposition: A | Discharge status: Home / Self Care | Source: Ambulatory Visit | Attending: Internal Medicine | Admitting: Internal Medicine

## 2024-09-22 DIAGNOSIS — K7689 Other specified diseases of liver: Secondary | ICD-10-CM

## 2024-09-22 MED ORDER — GADOPICLENOL 0.5 MMOL/ML IV SOLN
7.5000 mL | Freq: Once | INTRAVENOUS | Status: AC | PRN
  Administered 2024-09-22: 7.5 mL via INTRAVENOUS

## 2024-09-27 ENCOUNTER — Inpatient Hospital Stay: Attending: Oncology | Admitting: Oncology

## 2024-09-27 ENCOUNTER — Inpatient Hospital Stay

## 2024-09-27 ENCOUNTER — Encounter: Payer: Self-pay | Admitting: Oncology

## 2024-09-27 DIAGNOSIS — R160 Hepatomegaly, not elsewhere classified: Secondary | ICD-10-CM | POA: Diagnosis not present

## 2024-09-27 DIAGNOSIS — F1090 Alcohol use, unspecified, uncomplicated: Secondary | ICD-10-CM

## 2024-09-27 DIAGNOSIS — Z148 Genetic carrier of other disease: Secondary | ICD-10-CM | POA: Diagnosis not present

## 2024-09-27 MED ORDER — SODIUM CHLORIDE 0.9 % IV SOLN
Freq: Once | INTRAVENOUS | Status: DC
  Filled 2024-09-27: qty 250

## 2024-09-27 NOTE — Progress Notes (Signed)
 Jamie [name] presents today for phlebotomy per MD orders. Phlebotomy procedure started at 1342 and ended at 1354. 500 mls removed. Patient tolerated procedure well. IV needle removed intact.

## 2024-09-27 NOTE — Assessment & Plan Note (Signed)
 Normal AFP.  MRI showed benign appearance hemangioma

## 2024-09-27 NOTE — Assessment & Plan Note (Signed)
 Urine porphyrin results were reviewed and discussed with patient.  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 UROD genetic testing not available at [facility].  Patient will has skin manifestations will need treatment.  Elevated iron saturation. Recommend phlebotomy weekly x 3, goal of ferritin 50 Recommend retina examination baseline.

## 2024-09-27 NOTE — Assessment & Plan Note (Signed)
 Recommend alcohol cessation.

## 2024-09-27 NOTE — Progress Notes (Signed)
 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 UROD genetic testing not available at [facility].  Patient will has skin manifestations will need treatment.  Elevated iron saturation. Recommend phlebotomy weekly x 3, goal of ferritin 50 Recommend retina examination baseline.   Alcohol use Recommend alcohol cessation  Liver mass Normal AFP.  MRI showed benign appearance hemangioma   Orders Placed This Encounter  Procedures   CBC with Differential (Cancer Center Only)    Standing Status:   Future    Expected Date:   12/28/2024    Expiration Date:   03/28/2025   Iron and TIBC    Standing Status:   Future    Expected Date:   12/28/2024    Expiration Date:   03/28/2025   Ferritin    Standing Status:   Future    Expected Date:   12/28/2024    Expiration Date:   03/28/2025   Follow-up in 3 months All questions were answered. The patient knows to call the clinic with any problems, questions or concerns.  Zelphia Cap, MD, PhD [facility] Hematology Oncology 09/27/2024   HISTORY OF PRESENTING ILLNESS:   Jamie [name] is a  58 y.o.  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  Discussed the use of AI scribe software for clinical note transcription with the patient, who gave verbal consent to proceed.   He has been experiencing recurrent  vesicular eruptions on his hands for at least two years. These blisters are pruritic, leading to excoriation, scabbing, and subsequent scarring. Once one blister resolves, another appears elsewhere on his hands. There is no exacerbation of the blisters with sun exposure. He describes his skin as fragile, tearing easily with minor contact.  He has undergone prior workup  08/16/2024, Uroporphyrins 671 ug/L Heptacarboxyl  471 ug/L Hexacarboxyl  4 ug/L Pentacarboxyl 35 ug/Lug/L Coproporphyrin I 33 ug/L Coproporphyrin III 70 ug/L   Previous blood work showed positive hepatitis C antibody test but no active infection.  Negative for HIV.  Hepatitis B core antibody positive, IgM antibody negative. He is unsure of how he might have been exposed to hepatitis. stating 'I've done a lot of things in my life.  He consumes alcohol, typically two to three beers a day, but is attempting to reduce his intake, especially during workdays.  08/20/2024, right upper quadrant ultrasound showed 4.8 cm mass with a right liver.  Recommend further evaluation.  Patient denies abdominal pain, bleeding.   INTERVAL HISTORY Jamie [name] is a 58 y.o. male who has above history reviewed by me today presents for follow up visit for porphyria cutanea tarda, heterozygous hemochromatosis carrier  Patient is accompanied by her sister.  Patient continues to have intermittent blisters on his hands.  He denies taking iron supplementation, vitamin C supplementation.  MEDICAL HISTORY:  Past Medical History:  Diagnosis Date   Accident caused by powered lawn mower 12/23/1980  Diverticulosis 2019   Gout    Osteoarthritis     SURGICAL HISTORY: Past Surgical History:  Procedure Laterality Date   AMPUTATION TOE Right 1980   partial great toe and second toe; lawn mower accident   CARPOMETACARPAL ([facility]) FUSION OF THUMB Left 10/15/2021   Procedure: CARPOMETACARPAL ([facility]) FUSION OF THUMB;  Surgeon: Cleotilde Barrio, MD;   Location: [facility] ORS;  Service: Orthopedics;  Laterality: Left;   COLONOSCOPY     2008, 2019   excision right arm mass  04/04/2015   lipoma   MANDIBLE FRACTURE SURGERY  12/23/2001    SOCIAL HISTORY: Social History   Socioeconomic History   Marital status: Single    Spouse name: Not on file   Number of children: Not on file   Years of education: Not on file   Highest education level: Not on file  Occupational History   Not on file  Tobacco Use   Smoking status: Former    Current packs/day: 0.00    Types: Cigarettes    Start date: 12/23/1990    Quit date: 12/24/1991    Years since quitting: 32.7   Smokeless tobacco: Never  Vaping Use   Vaping status: Never Used  Substance and Sexual Activity   Alcohol use: Yes    Comment: 12 beers weekly   Drug use: No   Sexual activity: Not on file  Other Topics Concern   Not on file  Social History Narrative   Not on file   Social Drivers of Health   Financial Resource Strain: Low Risk  (08/11/2024)   Received from [facility]   Overall Financial Resource Strain (CARDIA)    Difficulty of Paying Living Expenses: Not very hard  Food Insecurity: No Food Insecurity (08/30/2024)   Hunger Vital Sign    Worried About Running Out of Food in the Last Year: Never true    Ran Out of Food in the Last Year: Never true  Transportation Needs: No Transportation Needs (08/30/2024)   PRAPARE - Administrator, Civil Service (Medical): No    Lack of Transportation (Non-Medical): No  Physical Activity: Not on file  Stress: Not on file  Social Connections: Not on file  Intimate Partner Violence: Not At Risk (08/30/2024)   Humiliation, Afraid, Rape, and Kick questionnaire    Fear of Current or Ex-Partner: No    Emotionally Abused: No    Physically Abused: No    Sexually Abused: No    FAMILY HISTORY: Family History  Problem Relation Age of Onset   Hypertension Mother    Cancer Father        colon   Colon cancer Father      ALLERGIES:  has no known allergies.  MEDICATIONS:  Current Outpatient Medications  Medication Sig Dispense Refill   allopurinol (ZYLOPRIM) 100 MG tablet Take 400 mg by mouth daily.   2   gabapentin  (NEURONTIN ) 400 MG capsule Take 1 capsule (400 mg total) by mouth 3 (three) times daily. 60 capsule 3   HYDROcodone -acetaminophen  (NORCO) 5-325 MG tablet Take 1-2 tablets by mouth every 6 (six) hours as needed. 50 tablet 0   meloxicam  (MOBIC ) 15 MG tablet Take 15 mg by mouth daily.     No current facility-administered medications for this visit.    Review of Systems  Constitutional:  Positive for fatigue. Negative for appetite change, chills, fever and unexpected weight change.  HENT:   Negative for hearing loss and voice change.   Eyes:  Negative for eye problems and icterus.  Respiratory:  Negative for chest tightness, cough and shortness of breath.   Cardiovascular:  Negative for chest pain and leg swelling.  Gastrointestinal:  Negative for abdominal distention and abdominal pain.  Endocrine: Negative for hot flashes.  Genitourinary:  Negative for difficulty urinating, dysuria and frequency.   Musculoskeletal:  Negative for arthralgias.  Skin:  Negative for itching and rash.       Blisters on bilateral hands.  Neurological:  Negative for light-headedness and numbness.  Hematological:  Negative for adenopathy. Does not bruise/bleed easily.  Psychiatric/Behavioral:  Negative for confusion.    PHYSICAL EXAMINATION:  Vitals:   09/27/24 1309  BP: (!) 148/80  Pulse: 71  Resp: 18  Temp: 98.2 F (36.8 C)  SpO2: 99%   Filed Weights   09/27/24 1309  Weight: 162 lb (73.5 kg)    Physical Exam Constitutional:      General: He is not in acute distress. HENT:     Head: Normocephalic and atraumatic.  Eyes:     General: No scleral icterus. Cardiovascular:     Rate and Rhythm: Normal rate and regular rhythm.     Heart sounds: Normal heart sounds.  Pulmonary:     Effort:  Pulmonary effort is normal. No respiratory distress.     Breath sounds: No wheezing.  Abdominal:     General: Bowel sounds are normal. There is no distension.     Palpations: Abdomen is soft.  Musculoskeletal:        General: No deformity. Normal range of motion.     Cervical back: Normal range of motion and neck supple.  Skin:    General: Skin is warm and dry.     Findings: Lesion present. No erythema or rash.  Neurological:     Mental Status: He is alert and oriented to person, place, and time. Mental status is at baseline.     Cranial Nerves: No cranial nerve deficit.     Coordination: Coordination normal.  Psychiatric:        Mood and Affect: Mood normal.     LABORATORY DATA:  I have reviewed the data as listed    Latest Ref Rng & Units 04/04/2015    9:42 AM  CBC  WBC 3.8 - 10.6 x10 3/mm 3 9.1   Hemoglobin 13.0 - 18.0 g/dL 84.3   Hematocrit 59.9 - 52.0 % 48.0   Platelets 150 - 440 x10 3/mm 3 194       Latest Ref Rng & Units 08/30/2024    3:35 PM  CMP  Total Protein 6.5 - 8.1 g/dL 7.9   Total Bilirubin 0.0 - 1.2 mg/dL 0.8   Alkaline Phos 38 - 126 U/L 58   AST 15 - 41 U/L 71   ALT 0 - 44 U/L 44       RADIOGRAPHIC STUDIES: I have personally reviewed the radiological images as listed and agreed with the findings in the report. MR ABDOMEN WWO CONTRAST Result Date: 09/24/2024 CLINICAL DATA:  Possible right liver mass on recent ultrasound performed for elevated liver enzymes. EXAM: MRI ABDOMEN WITHOUT AND WITH CONTRAST TECHNIQUE: Multiplanar multisequence MR imaging of the abdomen was performed both before and after the administration of intravenous contrast. CONTRAST:  7.5 cc Vueway IV COMPARISON:  08/20/2024 abdominal sonogram FINDINGS: Lower chest: No acute abnormality at the lung bases. Hepatobiliary: Normal liver size and configuration. No hepatic steatosis. There is a mildly lobulated 4.7 x 4.4 cm segment 5 right liver  mass on series 20/image 46 with mild T2  hyperintensity and near flash filling with progressive enhancement on delayed phases compatible with a cavernous hemangioma. No additional liver masses. Normal gallbladder with no cholelithiasis. No biliary ductal dilatation. Common bile duct diameter 2 mm. No choledocholithiasis. No biliary masses, strictures or beading. Pancreas: No pancreatic mass or duct dilation.  No pancreas divisum. Spleen: Normal size. No mass. Adrenals/Urinary Tract: Normal adrenals. No hydronephrosis. Scattered tiny subcentimeter simple renal cysts in both kidneys for which no follow-up imaging is recommended. No suspicious renal masses. Stomach/Bowel: Normal non-distended stomach. Visualized small and large bowel is normal caliber, with no bowel wall thickening. Vascular/Lymphatic: Normal caliber abdominal aorta. Patent portal, splenic, hepatic and renal veins. No pathologically enlarged lymph nodes in the abdomen. Other: No abdominal ascites or focal fluid collection. Musculoskeletal: No aggressive appearing focal osseous lesions. IMPRESSION: 1. 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 No suspicious liver masses. 2. No acute abnormality. No cholelithiasis. No biliary ductal dilatation. Electronically Signed   By: Selinda DELENA Blue M.D.   On: 09/24/2024 10:40   US  Abdomen Limited RUQ (LIVER/GB) Result Date: 08/25/2024 CLINICAL DATA:  elevated liver enzymes EXAM: ULTRASOUND ABDOMEN LIMITED RIGHT UPPER QUADRANT COMPARISON:  None Available. FINDINGS: Gallbladder: No gallstones or wall thickening visualized. No sonographic Murphy sign noted by sonographer. Common bile duct: Diameter: Visualized portion measures 3 mm, within normal limits. Liver: There is a heterogeneous possible mass within the RIGHT liver which measures 4.8 x 4.0 x 4.4 cm. Within normal limits in parenchymal echogenicity. Portal vein is patent on color Doppler imaging with normal direction of blood flow towards the liver. Other: None. IMPRESSION: There  is a possible 4.8 cm mass within the RIGHT liver. Recommend further evaluation with dedicated liver protocol MRI with and without contrast. These results will be called to the ordering clinician or representative by the Radiologist Assistant, and communication documented in the PACS or [employer]. Electronically Signed   By: Corean Salter M.D.   On: 08/25/2024 14:30

## 2024-09-27 NOTE — Patient Instructions (Signed)

## 2024-10-04 ENCOUNTER — Inpatient Hospital Stay

## 2024-10-04 NOTE — Patient Instructions (Signed)

## 2024-10-04 NOTE — Progress Notes (Signed)
 Jamie [name] presents today for phlebotomy per MD orders. Phlebotomy procedure started at 1305 and ended at 1325. removed. Patient tolerated procedure well. IV needle removed intact.

## 2024-10-18 ENCOUNTER — Inpatient Hospital Stay

## 2024-10-18 NOTE — Patient Instructions (Signed)

## 2024-10-18 NOTE — Progress Notes (Signed)
 Jamie [name] presents today for phlebotomy per MD orders. Phlebotomy procedure started at 1300 and ended at 1314. 500 mls  removed. Patient tolerated procedure well. IV needle removed intact.

## 2025-01-03 ENCOUNTER — Encounter: Payer: Self-pay | Admitting: Oncology

## 2025-01-03 ENCOUNTER — Inpatient Hospital Stay: Attending: Oncology

## 2025-01-03 DIAGNOSIS — F1090 Alcohol use, unspecified, uncomplicated: Secondary | ICD-10-CM | POA: Insufficient documentation

## 2025-01-03 DIAGNOSIS — Z148 Genetic carrier of other disease: Secondary | ICD-10-CM | POA: Diagnosis not present

## 2025-01-03 LAB — IRON AND TIBC
Iron: 84 ug/dL (ref 45–182)
Saturation Ratios: 25 % (ref 17.9–39.5)
TIBC: 333 ug/dL (ref 250–450)
UIBC: 249 ug/dL

## 2025-01-03 LAB — FERRITIN: Ferritin: 68 ng/mL (ref 24–336)

## 2025-01-03 LAB — CBC WITH DIFFERENTIAL (CANCER CENTER ONLY)
Abs Immature Granulocytes: 0.02 K/uL (ref 0.00–0.07)
Basophils Absolute: 0 K/uL (ref 0.0–0.1)
Basophils Relative: 1 %
Eosinophils Absolute: 0.1 K/uL (ref 0.0–0.5)
Eosinophils Relative: 2 %
HCT: 45 % (ref 39.0–52.0)
Hemoglobin: 15.2 g/dL (ref 13.0–17.0)
Immature Granulocytes: 0 %
Lymphocytes Relative: 50 %
Lymphs Abs: 3.1 K/uL (ref 0.7–4.0)
MCH: 30.8 pg (ref 26.0–34.0)
MCHC: 33.8 g/dL (ref 30.0–36.0)
MCV: 91.1 fL (ref 80.0–100.0)
Monocytes Absolute: 0.7 K/uL (ref 0.1–1.0)
Monocytes Relative: 12 %
Neutro Abs: 2.2 K/uL (ref 1.7–7.7)
Neutrophils Relative %: 35 %
Platelet Count: 179 K/uL (ref 150–400)
RBC: 4.94 MIL/uL (ref 4.22–5.81)
RDW: 13.2 % (ref 11.5–15.5)
WBC Count: 6.1 K/uL (ref 4.0–10.5)
nRBC: 0 % (ref 0.0–0.2)

## 2025-01-05 ENCOUNTER — Inpatient Hospital Stay: Admitting: Oncology

## 2025-01-05 ENCOUNTER — Encounter: Payer: Self-pay | Admitting: Oncology

## 2025-01-05 ENCOUNTER — Inpatient Hospital Stay

## 2025-01-05 DIAGNOSIS — Z148 Genetic carrier of other disease: Secondary | ICD-10-CM | POA: Insufficient documentation

## 2025-01-05 DIAGNOSIS — F1090 Alcohol use, unspecified, uncomplicated: Secondary | ICD-10-CM | POA: Diagnosis not present

## 2025-01-05 MED ORDER — SODIUM CHLORIDE 0.9 % IV SOLN
Freq: Once | INTRAVENOUS | Status: DC
  Filled 2025-01-05: qty 250

## 2025-01-05 NOTE — Progress Notes (Signed)
 " Hematology/Oncology Consult note [phone] [phone]        REFERRING PROVIDER: Fernande Ophelia JINNY DOUGLAS, MD   CHIEF COMPLAINTS/REASON FOR VISIT:  porphyria cutaneous tarda   ASSESSMENT & PLAN:   Porphyria cutanea tarda (HCC) Urine porphyrin results were reviewed and discussed with patient.  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 UROD genetic testing not available at [facility].  He has improved skin symptoms. Iron saturation has improve.d  Recommend phlebotomy today goal of is ferritin 50 Recommend retina examination baseline.   Alcohol use Recommend alcohol cessation  Hemochromatosis carrier See above management.    Orders Placed This Encounter  Procedures   CBC with Differential (Cancer Center Only)    Standing Status:   Future    Expected Date:   04/05/2025    Expiration Date:   07/04/2025   Iron and TIBC    Standing Status:   Future    Expected Date:   04/05/2025    Expiration Date:   07/04/2025   Ferritin    Standing Status:   Future    Expected Date:   04/05/2025    Expiration Date:   07/04/2025   Follow-up in 3 months All questions were answered. The patient knows to call the clinic with any problems, questions or concerns.  Zelphia Cap, MD, PhD [facility] Hematology Oncology 01/05/2025   HISTORY OF PRESENTING ILLNESS:   Jamie [name] is a  59 y.o.  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  Discussed the use of AI scribe software for clinical note transcription with the patient, who gave verbal consent to proceed.   He has been experiencing recurrent vesicular eruptions on his hands for at least two years.  These blisters are pruritic, leading to excoriation, scabbing, and subsequent scarring. Once one blister resolves, another appears elsewhere on his hands. There is no exacerbation of the blisters with sun exposure. He describes his skin as fragile, tearing easily with minor contact.  He has undergone prior workup  08/16/2024, Uroporphyrins 671 ug/L Heptacarboxyl  471 ug/L Hexacarboxyl  4 ug/L Pentacarboxyl 35 ug/Lug/L Coproporphyrin I 33 ug/L Coproporphyrin III 70 ug/L   Previous blood work showed positive hepatitis C antibody test but no active infection.  Negative for HIV.  Hepatitis B core antibody positive, IgM antibody negative. He is unsure of how he might have been exposed to hepatitis. stating 'I've done a lot of things in my life.  He consumes alcohol, typically two to three beers a day, but is attempting to reduce his intake, especially during workdays.  08/20/2024, right upper quadrant ultrasound showed 4.8 cm mass with a right liver.  Recommend further evaluation.  Patient denies abdominal pain, bleeding.   INTERVAL HISTORY Jamie [name] is a 59 y.o. male who has above history reviewed by me today presents for follow up visit for porphyria cutanea tarda, heterozygous hemochromatosis carrier  Patient tolerated phlebotomy. He continues to hav skin blisters, but much improved.   MEDICAL HISTORY:  Past Medical History:  Diagnosis Date   Accident caused by powered lawn mower 12/23/1980   Diverticulosis 2019   Gout    Osteoarthritis     SURGICAL HISTORY: Past Surgical History:  Procedure  Laterality Date   AMPUTATION TOE Right 1980   partial great toe and second toe; lawn mower accident   CARPOMETACARPAL [facility]) FUSION OF THUMB Left 10/15/2021   Procedure: CARPOMETACARPAL [facility]) FUSION OF THUMB;  Surgeon: Cleotilde Barrio, MD;  Location: [facility] ORS;  Service: Orthopedics;  Laterality: Left;   COLONOSCOPY     2008, 2019   excision right arm mass  04/04/2015   lipoma    MANDIBLE FRACTURE SURGERY  12/23/2001    SOCIAL HISTORY: Social History   Socioeconomic History   Marital status: Single    Spouse name: Not on file   Number of children: Not on file   Years of education: Not on file   Highest education level: Not on file  Occupational History   Not on file  Tobacco Use   Smoking status: Former    Current packs/day: 0.00    Types: Cigarettes    Start date: 12/23/1990    Quit date: 12/24/1991    Years since quitting: 33.0   Smokeless tobacco: Never  Vaping Use   Vaping status: Never Used  Substance and Sexual Activity   Alcohol use: Yes    Comment: 12 beers weekly   Drug use: No   Sexual activity: Not on file  Other Topics Concern   Not on file  Social History Narrative   Not on file   Social Drivers of Health   Tobacco Use: Medium Risk (01/05/2025)   Patient History    Smoking Tobacco Use: Former    Smokeless Tobacco Use: Never    Passive Exposure: Not on Actuary Strain: Low Risk  (08/11/2024)   Received from [facility]   Overall Financial Resource Strain (CARDIA)    Difficulty of Paying Living Expenses: Not very hard  Food Insecurity: No Food Insecurity (08/30/2024)   Epic    Worried About Running Out of Food in the Last Year: Never true    Ran Out of Food in the Last Year: Never true  Transportation Needs: No Transportation Needs (08/30/2024)   Epic    Lack of Transportation (Medical): No    Lack of Transportation (Non-Medical): No  Physical Activity: Not on file  Stress: Not on file  Social Connections: Not on file  Intimate Partner Violence: Not At Risk (08/30/2024)   Epic    Fear of Current or Ex-Partner: No    Emotionally Abused: No    Physically Abused: No    Sexually Abused: No  Depression (PHQ2-9): Low Risk (08/30/2024)   Depression (PHQ2-9)    PHQ-2 Score: 0  Alcohol Screen: Not on file  Housing: Low Risk (08/30/2024)   Epic    Unable to Pay for Housing in the Last Year: No    Number  of Times Moved in the Last Year: 0    Homeless in the Last Year: No  Utilities: Not At Risk (08/30/2024)   Epic    Threatened with loss of utilities: No  Health Literacy: Not on file    FAMILY HISTORY: Family History  Problem Relation Age of Onset   Hypertension Mother    Cancer Father        colon   Colon cancer Father     ALLERGIES:  has no known allergies.  MEDICATIONS:  Current Outpatient Medications  Medication Sig Dispense Refill   allopurinol (ZYLOPRIM) 100 MG tablet Take 400 mg by mouth daily.   2   No current facility-administered medications for this visit.   Facility-Administered Medications  Ordered in Other Visits  Medication Dose Route Frequency Provider Last Rate Last Admin   0.9 %  sodium chloride  infusion   Intravenous Once Babara Call, MD        Review of Systems  Constitutional:  Positive for fatigue. Negative for appetite change, chills, fever and unexpected weight change.  HENT:   Negative for hearing loss and voice change.   Eyes:  Negative for eye problems and icterus.  Respiratory:  Negative for chest tightness, cough and shortness of breath.   Cardiovascular:  Negative for chest pain and leg swelling.  Gastrointestinal:  Negative for abdominal distention and abdominal pain.  Endocrine: Negative for hot flashes.  Genitourinary:  Negative for difficulty urinating, dysuria and frequency.   Musculoskeletal:  Negative for arthralgias.  Skin:  Negative for itching and rash.       Blisters on bilateral hands.  Neurological:  Negative for light-headedness and numbness.  Hematological:  Negative for adenopathy. Does not bruise/bleed easily.  Psychiatric/Behavioral:  Negative for confusion.    PHYSICAL EXAMINATION:  Vitals:   01/05/25 1314 01/05/25 1321  BP: (!) 144/102 (!) 140/89  Pulse: 63   Resp: 18   Temp: (!) 97.1 F (36.2 C)   SpO2: 98%    Filed Weights   01/05/25 1314  Weight: 167 lb 3.2 oz (75.8 kg)    Physical Exam Constitutional:       General: He is not in acute distress. HENT:     Head: Normocephalic and atraumatic.  Eyes:     General: No scleral icterus. Cardiovascular:     Rate and Rhythm: Normal rate and regular rhythm.     Heart sounds: Normal heart sounds.  Pulmonary:     Effort: Pulmonary effort is normal. No respiratory distress.     Breath sounds: No wheezing.  Abdominal:     General: Bowel sounds are normal. There is no distension.     Palpations: Abdomen is soft.  Musculoskeletal:        General: No deformity. Normal range of motion.     Cervical back: Normal range of motion and neck supple.  Skin:    General: Skin is warm and dry.     Findings: Lesion present. No erythema or rash.  Neurological:     Mental Status: He is alert and oriented to person, place, and time. Mental status is at baseline.     Cranial Nerves: No cranial nerve deficit.     Coordination: Coordination normal.  Psychiatric:        Mood and Affect: Mood normal.     LABORATORY DATA:  I have reviewed the data as listed    Latest Ref Rng & Units 01/03/2025   12:54 PM 04/04/2015    9:42 AM  CBC  WBC 4.0 - 10.5 K/uL 6.1  9.1   Hemoglobin 13.0 - 17.0 g/dL 84.7  84.3   Hematocrit 39.0 - 52.0 % 45.0  48.0   Platelets 150 - 400 K/uL 179  194       Latest Ref Rng & Units 08/30/2024    3:35 PM  CMP  Total Protein 6.5 - 8.1 g/dL 7.9   Total Bilirubin 0.0 - 1.2 mg/dL 0.8   Alkaline Phos 38 - 126 U/L 58   AST 15 - 41 U/L 71   ALT 0 - 44 U/L 44       RADIOGRAPHIC STUDIES: I have personally reviewed the radiological images as listed and agreed with the findings in the report. No  results found.        "

## 2025-01-05 NOTE — Progress Notes (Signed)
 Jamie [name] presents today for phlebotomy per MD orders. Phlebotomy procedure started at 1345 and ended at 1400. 500 mls removed. Patient tolerated procedure well. IV needle removed intact.

## 2025-01-05 NOTE — Assessment & Plan Note (Signed)
 See above management

## 2025-01-05 NOTE — Assessment & Plan Note (Signed)
 Recommend alcohol cessation.

## 2025-01-05 NOTE — Assessment & Plan Note (Addendum)
 Urine porphyrin results were reviewed and discussed with patient.  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 UROD genetic testing not available at [facility].  He has improved skin symptoms. Iron saturation has improve.d  Recommend phlebotomy today goal of is ferritin 50 Recommend retina examination baseline.

## 2025-01-05 NOTE — Patient Instructions (Signed)

## 2025-04-04 ENCOUNTER — Inpatient Hospital Stay

## 2025-04-06 ENCOUNTER — Inpatient Hospital Stay: Admitting: Oncology

## 2025-04-06 ENCOUNTER — Inpatient Hospital Stay
